# Patient Record
Sex: Female | Born: 1985 | Race: Asian | Hispanic: No | Marital: Married | Smoking: Never smoker
Health system: Southern US, Community
[De-identification: ages and names within clinical notes are randomized; demographics above are authoritative.]

## PROBLEM LIST (undated history)

## (undated) DIAGNOSIS — K602 Anal fissure, unspecified: Secondary | ICD-10-CM

## (undated) HISTORY — DX: Anal fissure, unspecified: K60.2

---

## 2013-07-01 LAB — OB RESULTS CONSOLE HEPATITIS B SURFACE ANTIGEN: HEP B S AG: NEGATIVE

## 2013-07-01 LAB — OB RESULTS CONSOLE ABO/RH: RH Type: POSITIVE

## 2013-07-01 LAB — OB RESULTS CONSOLE HIV ANTIBODY (ROUTINE TESTING): HIV: NONREACTIVE

## 2013-07-01 LAB — OB RESULTS CONSOLE ANTIBODY SCREEN: Antibody Screen: NEGATIVE

## 2013-07-01 LAB — OB RESULTS CONSOLE RPR: RPR: NONREACTIVE

## 2013-10-09 ENCOUNTER — Inpatient Hospital Stay (HOSPITAL_COMMUNITY): Admission: AD | Admit: 2013-10-09 | Payer: Self-pay | Source: Ambulatory Visit | Admitting: Obstetrics & Gynecology

## 2013-11-12 LAB — OB RESULTS CONSOLE RPR: RPR: NONREACTIVE

## 2014-01-07 LAB — OB RESULTS CONSOLE GBS: STREP GROUP B AG: NEGATIVE

## 2014-01-08 ENCOUNTER — Other Ambulatory Visit: Payer: Self-pay | Admitting: Obstetrics & Gynecology

## 2014-01-09 ENCOUNTER — Encounter (HOSPITAL_COMMUNITY): Payer: Self-pay

## 2014-01-15 ENCOUNTER — Encounter (HOSPITAL_COMMUNITY): Payer: Self-pay

## 2014-01-16 ENCOUNTER — Encounter (HOSPITAL_COMMUNITY)
Admission: RE | Admit: 2014-01-16 | Discharge: 2014-01-16 | Disposition: A | Discharge status: Home / Self Care | Payer: Managed Care, Other (non HMO) | Source: Ambulatory Visit | Attending: Obstetrics & Gynecology | Admitting: Obstetrics & Gynecology

## 2014-01-16 ENCOUNTER — Encounter (HOSPITAL_COMMUNITY): Payer: Self-pay

## 2014-01-16 LAB — CBC
HCT: 36.3 % (ref 36.0–46.0)
Hemoglobin: 12.1 g/dL (ref 12.0–15.0)
MCH: 28.5 pg (ref 26.0–34.0)
MCHC: 33.3 g/dL (ref 30.0–36.0)
MCV: 85.6 fL (ref 78.0–100.0)
Platelets: 239 10*3/uL (ref 150–400)
RBC: 4.24 MIL/uL (ref 3.87–5.11)
RDW: 14 % (ref 11.5–15.5)
WBC: 8.2 10*3/uL (ref 4.0–10.5)

## 2014-01-16 LAB — ABO/RH: ABO/RH(D): B POS

## 2014-01-16 LAB — TYPE AND SCREEN
ABO/RH(D): B POS
Antibody Screen: NEGATIVE

## 2014-01-16 LAB — RPR

## 2014-01-16 NOTE — Patient Instructions (Addendum)
   Your procedure is scheduled on:  Friday, Oct 23  Enter through the Main Entrance of [facility] at:  7:45 AM Pick up the phone at the desk and dial [phone] and inform us of your arrival.  Please call this number if you have any problems the morning of surgery: [phone]  Remember: Do not eat or drink after midnight:  Thursday Take these medicines the morning of surgery with a SIP OF WATER:  None  Do not wear jewelry, make-up, or FINGER nail polish No metal in your hair or on your body. Do not wear lotions, powders, perfumes.  You may wear deodorant.  Do not bring valuables to the hospital. Contacts, dentures or bridgework may not be worn into surgery.  Leave suitcase in the car. After Surgery it may be brought to your room. For patients being admitted to the hospital, checkout time is 11:00am the day of discharge.  Home with husband Corie Chiquitoanda cell [phone].

## 2014-01-17 ENCOUNTER — Inpatient Hospital Stay (HOSPITAL_COMMUNITY)
Admission: AD | Admit: 2014-01-17 | Discharge: 2014-01-19 | DRG: 765 | Disposition: A | Discharge status: Home / Self Care | Payer: Managed Care, Other (non HMO) | Source: Ambulatory Visit | Attending: Obstetrics & Gynecology | Admitting: Obstetrics & Gynecology

## 2014-01-17 ENCOUNTER — Encounter (HOSPITAL_COMMUNITY): Payer: Self-pay | Admitting: Anesthesiology

## 2014-01-17 ENCOUNTER — Encounter (HOSPITAL_COMMUNITY): Payer: Managed Care, Other (non HMO) | Admitting: Anesthesiology

## 2014-01-17 ENCOUNTER — Encounter (HOSPITAL_COMMUNITY)
Admission: AD | Disposition: A | Discharge status: Home / Self Care | Payer: Self-pay | Source: Ambulatory Visit | Attending: Obstetrics & Gynecology

## 2014-01-17 ENCOUNTER — Inpatient Hospital Stay (HOSPITAL_COMMUNITY): Payer: Managed Care, Other (non HMO) | Admitting: Anesthesiology

## 2014-01-17 DIAGNOSIS — O321XX0 Maternal care for breech presentation, not applicable or unspecified: Secondary | ICD-10-CM | POA: Diagnosis present

## 2014-01-17 DIAGNOSIS — Z3A37 37 weeks gestation of pregnancy: Secondary | ICD-10-CM | POA: Diagnosis present

## 2014-01-17 DIAGNOSIS — O4103X0 Oligohydramnios, third trimester, not applicable or unspecified: Secondary | ICD-10-CM | POA: Diagnosis present

## 2014-01-17 DIAGNOSIS — O365930 Maternal care for other known or suspected poor fetal growth, third trimester, not applicable or unspecified: Secondary | ICD-10-CM | POA: Diagnosis present

## 2014-01-17 SURGERY — Surgical Case
Anesthesia: Spinal | Site: Abdomen

## 2014-01-17 MED ORDER — PRENATAL MULTIVITAMIN CH
1.0000 | ORAL_TABLET | Freq: Every day | ORAL | Status: DC
  Administered 2014-01-17 – 2014-01-19 (×3): 1 via ORAL
  Filled 2014-01-17 (×3): qty 1

## 2014-01-17 MED ORDER — WITCH HAZEL-GLYCERIN EX PADS: 1.0000 "application " | MEDICATED_PAD | CUTANEOUS | Status: DC | PRN

## 2014-01-17 MED ORDER — METOCLOPRAMIDE HCL 5 MG/ML IJ SOLN: 10.0000 mg | Freq: Once | INTRAMUSCULAR | Status: DC | PRN

## 2014-01-17 MED ORDER — NALBUPHINE HCL 10 MG/ML IJ SOLN: 5.0000 mg | Freq: Once | INTRAMUSCULAR | Status: AC | PRN

## 2014-01-17 MED ORDER — FENTANYL CITRATE 0.05 MG/ML IJ SOLN
INTRAMUSCULAR | Status: AC
  Administered 2014-01-17: 25 ug via INTRAVENOUS
  Filled 2014-01-17: qty 2

## 2014-01-17 MED ORDER — CEFAZOLIN SODIUM-DEXTROSE 2-3 GM-% IV SOLR
2.0000 g | INTRAVENOUS | Status: AC
  Administered 2014-01-17: 2 g via INTRAVENOUS

## 2014-01-17 MED ORDER — DIPHENHYDRAMINE HCL 25 MG PO CAPS
25.0000 mg | ORAL_CAPSULE | ORAL | Status: DC | PRN
  Filled 2014-01-17: qty 1

## 2014-01-17 MED ORDER — LANOLIN HYDROUS EX OINT: 1.0000 "application " | TOPICAL_OINTMENT | CUTANEOUS | Status: DC | PRN

## 2014-01-17 MED ORDER — FENTANYL CITRATE 0.05 MG/ML IJ SOLN
INTRAMUSCULAR | Status: DC | PRN
  Administered 2014-01-17: 12.5 ug via INTRATHECAL

## 2014-01-17 MED ORDER — BUPIVACAINE IN DEXTROSE 0.75-8.25 % IT SOLN
INTRATHECAL | Status: DC | PRN
  Administered 2014-01-17: 1.2 mL via INTRATHECAL

## 2014-01-17 MED ORDER — MEPERIDINE HCL 25 MG/ML IJ SOLN: 6.2500 mg | INTRAMUSCULAR | Status: DC | PRN

## 2014-01-17 MED ORDER — ONDANSETRON HCL 4 MG/2ML IJ SOLN: 4.0000 mg | INTRAMUSCULAR | Status: DC | PRN

## 2014-01-17 MED ORDER — MORPHINE SULFATE (PF) 0.5 MG/ML IJ SOLN
INTRAMUSCULAR | Status: DC | PRN
  Administered 2014-01-17: 200 ug via INTRATHECAL

## 2014-01-17 MED ORDER — PHENYLEPHRINE HCL 10 MG/ML IJ SOLN
INTRAMUSCULAR | Status: DC | PRN
  Administered 2014-01-17: 60 ug via INTRAVENOUS

## 2014-01-17 MED ORDER — DIPHENHYDRAMINE HCL 50 MG/ML IJ SOLN
12.5000 mg | INTRAMUSCULAR | Status: DC | PRN
  Administered 2014-01-17 (×2): 12.5 mg via INTRAVENOUS
  Filled 2014-01-17: qty 1

## 2014-01-17 MED ORDER — DIPHENHYDRAMINE HCL 50 MG/ML IJ SOLN
INTRAMUSCULAR | Status: AC
  Filled 2014-01-17: qty 1

## 2014-01-17 MED ORDER — SCOPOLAMINE 1 MG/3DAYS TD PT72: 1.0000 | MEDICATED_PATCH | TRANSDERMAL | Status: DC

## 2014-01-17 MED ORDER — KETOROLAC TROMETHAMINE 30 MG/ML IJ SOLN
INTRAMUSCULAR | Status: AC
  Filled 2014-01-17: qty 1

## 2014-01-17 MED ORDER — SENNOSIDES-DOCUSATE SODIUM 8.6-50 MG PO TABS
2.0000 | ORAL_TABLET | ORAL | Status: DC
Start: 2014-01-18 — End: 2014-01-19
  Administered 2014-01-18 (×2): 2 via ORAL
  Filled 2014-01-17 (×2): qty 2

## 2014-01-17 MED ORDER — NALBUPHINE HCL 10 MG/ML IJ SOLN: 5.0000 mg | INTRAMUSCULAR | Status: DC | PRN

## 2014-01-17 MED ORDER — KETOROLAC TROMETHAMINE 30 MG/ML IJ SOLN
30.0000 mg | Freq: Four times a day (QID) | INTRAMUSCULAR | Status: AC | PRN
  Administered 2014-01-17: 30 mg via INTRAMUSCULAR

## 2014-01-17 MED ORDER — SCOPOLAMINE 1 MG/3DAYS TD PT72
MEDICATED_PATCH | TRANSDERMAL | Status: AC
  Filled 2014-01-17: qty 1

## 2014-01-17 MED ORDER — OXYTOCIN 40 UNITS IN LACTATED RINGERS INFUSION - SIMPLE MED: 62.5000 mL/h | INTRAVENOUS | Status: AC

## 2014-01-17 MED ORDER — ZOLPIDEM TARTRATE 5 MG PO TABS: 5.0000 mg | ORAL_TABLET | Freq: Every evening | ORAL | Status: DC | PRN

## 2014-01-17 MED ORDER — KETOROLAC TROMETHAMINE 30 MG/ML IJ SOLN: 30.0000 mg | Freq: Four times a day (QID) | INTRAMUSCULAR | Status: AC | PRN

## 2014-01-17 MED ORDER — MEPERIDINE HCL 25 MG/ML IJ SOLN
INTRAMUSCULAR | Status: AC
  Administered 2014-01-17: 6.25 mg via INTRAVENOUS
  Filled 2014-01-17: qty 1

## 2014-01-17 MED ORDER — NALOXONE HCL 0.4 MG/ML IJ SOLN: 0.4000 mg | INTRAMUSCULAR | Status: DC | PRN

## 2014-01-17 MED ORDER — NALBUPHINE HCL 10 MG/ML IJ SOLN
INTRAMUSCULAR | Status: AC
  Administered 2014-01-17: 5 mg via SUBCUTANEOUS
  Filled 2014-01-17: qty 1

## 2014-01-17 MED ORDER — SIMETHICONE 80 MG PO CHEW: 80.0000 mg | CHEWABLE_TABLET | ORAL | Status: DC | PRN

## 2014-01-17 MED ORDER — TETANUS-DIPHTH-ACELL PERTUSSIS 5-2.5-18.5 LF-MCG/0.5 IM SUSP: 0.5000 mL | Freq: Once | INTRAMUSCULAR | Status: DC

## 2014-01-17 MED ORDER — PHENYLEPHRINE HCL 10 MG/ML IJ SOLN
INTRAMUSCULAR | Status: AC
  Filled 2014-01-17: qty 1

## 2014-01-17 MED ORDER — MENTHOL 3 MG MT LOZG: 1.0000 | LOZENGE | OROMUCOSAL | Status: DC | PRN

## 2014-01-17 MED ORDER — OXYCODONE-ACETAMINOPHEN 5-325 MG PO TABS: 2.0000 | ORAL_TABLET | ORAL | Status: DC | PRN

## 2014-01-17 MED ORDER — SIMETHICONE 80 MG PO CHEW
80.0000 mg | CHEWABLE_TABLET | Freq: Three times a day (TID) | ORAL | Status: DC
Start: 2014-01-17 — End: 2014-01-19
  Administered 2014-01-17 – 2014-01-19 (×6): 80 mg via ORAL
  Filled 2014-01-17 (×6): qty 1

## 2014-01-17 MED ORDER — PHENYLEPHRINE 40 MCG/ML (10ML) SYRINGE FOR IV PUSH (FOR BLOOD PRESSURE SUPPORT)
PREFILLED_SYRINGE | INTRAVENOUS | Status: AC
  Filled 2014-01-17: qty 5

## 2014-01-17 MED ORDER — LACTATED RINGERS IV SOLN
INTRAVENOUS | Status: DC
  Administered 2014-01-17 – 2014-01-18 (×2): via INTRAVENOUS

## 2014-01-17 MED ORDER — IBUPROFEN 600 MG PO TABS
600.0000 mg | ORAL_TABLET | Freq: Four times a day (QID) | ORAL | Status: DC
  Administered 2014-01-17 – 2014-01-19 (×8): 600 mg via ORAL
  Filled 2014-01-17 (×8): qty 1

## 2014-01-17 MED ORDER — DEXTROSE 5 % IV SOLN
1.0000 ug/kg/h | INTRAVENOUS | Status: DC | PRN
  Filled 2014-01-17: qty 2

## 2014-01-17 MED ORDER — OXYTOCIN 10 UNIT/ML IJ SOLN
40.0000 [IU] | INTRAVENOUS | Status: DC | PRN
  Administered 2014-01-17: 40 [IU] via INTRAVENOUS

## 2014-01-17 MED ORDER — SODIUM CHLORIDE 0.9 % IJ SOLN: 3.0000 mL | INTRAMUSCULAR | Status: DC | PRN

## 2014-01-17 MED ORDER — CEFAZOLIN SODIUM-DEXTROSE 2-3 GM-% IV SOLR
INTRAVENOUS | Status: AC
  Administered 2014-01-17: 2 g via INTRAVENOUS
  Filled 2014-01-17: qty 50

## 2014-01-17 MED ORDER — ONDANSETRON HCL 4 MG/2ML IJ SOLN
INTRAMUSCULAR | Status: DC | PRN
  Administered 2014-01-17: 4 mg via INTRAVENOUS

## 2014-01-17 MED ORDER — MORPHINE SULFATE 0.5 MG/ML IJ SOLN
INTRAMUSCULAR | Status: AC
  Filled 2014-01-17: qty 10

## 2014-01-17 MED ORDER — FENTANYL CITRATE 0.05 MG/ML IJ SOLN
INTRAMUSCULAR | Status: AC
Start: 2014-01-17 — End: 2014-01-17
  Filled 2014-01-17: qty 2

## 2014-01-17 MED ORDER — ONDANSETRON HCL 4 MG PO TABS: 4.0000 mg | ORAL_TABLET | ORAL | Status: DC | PRN

## 2014-01-17 MED ORDER — SCOPOLAMINE 1 MG/3DAYS TD PT72
1.0000 | MEDICATED_PATCH | Freq: Once | TRANSDERMAL | Status: DC
Start: 2014-01-17 — End: 2014-01-17
  Administered 2014-01-17: 1.5 mg via TRANSDERMAL

## 2014-01-17 MED ORDER — FENTANYL CITRATE 0.05 MG/ML IJ SOLN
25.0000 ug | INTRAMUSCULAR | Status: DC | PRN
  Administered 2014-01-17 (×2): 25 ug via INTRAVENOUS

## 2014-01-17 MED ORDER — DIPHENHYDRAMINE HCL 25 MG PO CAPS: 25.0000 mg | ORAL_CAPSULE | Freq: Four times a day (QID) | ORAL | Status: DC | PRN

## 2014-01-17 MED ORDER — SIMETHICONE 80 MG PO CHEW
80.0000 mg | CHEWABLE_TABLET | ORAL | Status: DC
  Administered 2014-01-18 (×2): 80 mg via ORAL
  Filled 2014-01-17 (×2): qty 1

## 2014-01-17 MED ORDER — DIBUCAINE 1 % RE OINT: 1.0000 "application " | TOPICAL_OINTMENT | RECTAL | Status: DC | PRN

## 2014-01-17 MED ORDER — MEPERIDINE HCL 25 MG/ML IJ SOLN
6.2500 mg | INTRAMUSCULAR | Status: DC | PRN
  Administered 2014-01-17: 6.25 mg via INTRAVENOUS

## 2014-01-17 MED ORDER — OXYCODONE-ACETAMINOPHEN 5-325 MG PO TABS
1.0000 | ORAL_TABLET | ORAL | Status: DC | PRN
  Administered 2014-01-18 – 2014-01-19 (×2): 1 via ORAL
  Filled 2014-01-17 (×2): qty 1

## 2014-01-17 MED ORDER — ONDANSETRON HCL 4 MG/2ML IJ SOLN
INTRAMUSCULAR | Status: AC
  Filled 2014-01-17: qty 2

## 2014-01-17 MED ORDER — NALBUPHINE HCL 10 MG/ML IJ SOLN
5.0000 mg | INTRAMUSCULAR | Status: DC | PRN
  Administered 2014-01-17: 5 mg via SUBCUTANEOUS

## 2014-01-17 MED ORDER — LACTATED RINGERS IV SOLN
INTRAVENOUS | Status: DC
  Administered 2014-01-17 (×2): via INTRAVENOUS

## 2014-01-17 MED ORDER — OXYTOCIN 10 UNIT/ML IJ SOLN
INTRAMUSCULAR | Status: AC
  Filled 2014-01-17: qty 4

## 2014-01-17 MED ORDER — ONDANSETRON HCL 4 MG/2ML IJ SOLN: 4.0000 mg | Freq: Three times a day (TID) | INTRAMUSCULAR | Status: DC | PRN

## 2014-01-17 SURGICAL SUPPLY — 39 items
BENZOIN TINCTURE PRP APPL 2/3 (GAUZE/BANDAGES/DRESSINGS) ×3 IMPLANT
CLAMP CORD UMBIL (MISCELLANEOUS) IMPLANT
CLOSURE WOUND 1/2 X4 (GAUZE/BANDAGES/DRESSINGS) ×1
CLOTH BEACON ORANGE TIMEOUT ST (SAFETY) ×3 IMPLANT
CONTAINER PREFILL 10% NBF 15ML (MISCELLANEOUS) IMPLANT
DRAPE SHEET LG 3/4 BI-LAMINATE (DRAPES) ×3 IMPLANT
DRSG OPSITE POSTOP 4X10 (GAUZE/BANDAGES/DRESSINGS) ×3 IMPLANT
DURAPREP 26ML APPLICATOR (WOUND CARE) ×3 IMPLANT
ELECT REM PT RETURN 9FT ADLT (ELECTROSURGICAL) ×3
ELECTRODE REM PT RTRN 9FT ADLT (ELECTROSURGICAL) ×1 IMPLANT
EXTRACTOR VACUUM KIWI (MISCELLANEOUS) IMPLANT
EXTRACTOR VACUUM M CUP 4 TUBE (SUCTIONS) IMPLANT
EXTRACTOR VACUUM M CUP 4' TUBE (SUCTIONS)
GLOVE BIO SURGEON STRL SZ7 (GLOVE) ×3 IMPLANT
GLOVE BIOGEL PI IND STRL 7.0 (GLOVE) ×5 IMPLANT
GLOVE BIOGEL PI INDICATOR 7.0 (GLOVE) ×10
GLOVE ECLIPSE 7.5 STRL STRAW (GLOVE) ×3 IMPLANT
GOWN STRL REUS W/TWL LRG LVL3 (GOWN DISPOSABLE) ×9 IMPLANT
KIT ABG SYR 3ML LUER SLIP (SYRINGE) IMPLANT
NEEDLE HYPO 25X5/8 SAFETYGLIDE (NEEDLE) IMPLANT
NS IRRIG 1000ML POUR BTL (IV SOLUTION) ×3 IMPLANT
PACK C SECTION WH (CUSTOM PROCEDURE TRAY) ×3 IMPLANT
PAD OB MATERNITY 4.3X12.25 (PERSONAL CARE ITEMS) ×3 IMPLANT
RTRCTR C-SECT PINK 25CM LRG (MISCELLANEOUS) IMPLANT
STAPLER VISISTAT 35W (STAPLE) IMPLANT
STRIP CLOSURE SKIN 1/2X4 (GAUZE/BANDAGES/DRESSINGS) ×2 IMPLANT
SUT MON AB-0 CT1 36 (SUTURE) ×9 IMPLANT
SUT PLAIN 0 NONE (SUTURE) IMPLANT
SUT PLAIN 2 0 (SUTURE) ×2
SUT PLAIN ABS 2-0 CT1 27XMFL (SUTURE) ×1 IMPLANT
SUT VIC AB 0 CT1 27 (SUTURE) ×4
SUT VIC AB 0 CT1 27XBRD ANBCTR (SUTURE) ×2 IMPLANT
SUT VIC AB 2-0 CT1 27 (SUTURE) ×4
SUT VIC AB 2-0 CT1 TAPERPNT 27 (SUTURE) ×2 IMPLANT
SUT VIC AB 4-0 KS 27 (SUTURE) ×3 IMPLANT
SUT VICRYL 0 TIES 12 18 (SUTURE) IMPLANT
TOWEL OR 17X24 6PK STRL BLUE (TOWEL DISPOSABLE) ×3 IMPLANT
TRAY FOLEY CATH 14FR (SET/KITS/TRAYS/PACK) IMPLANT
WATER STERILE IRR 1000ML POUR (IV SOLUTION) ×3 IMPLANT

## 2014-01-17 NOTE — Consult Note (Signed)
[facility]  Delivery Note:  C-section       01/17/2014  8:50 AM  I was called to the operating room at the request of the patient's obstetrician (Dr. Juliene PinaMody) for a primary c-section.  PRENATAL HX:  28 y/o G1P0 at 4637 and 5/[redacted] weeks gestation.  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  Delivery indicated at this time for oligohydramnios and mode by c-section for breech presentation.    INTRAPARTUM HX:   Primary c-section with AROM at delivery.  DELIVERY:  Infant was vigorous at delivery, requiring no resuscitation other than standard warming, drying and stimulation.  APGARs 8 and 9.  Exam notable for 2 vessel cord but otherwise was within normal limits.  There is no murmur at this time to suggest a VSD, though cannot rule it out at this time, as murmur may not become apparent until pulmonary vascular resistance decreases.  After 5 minutes, baby left with nurse to assist parents with skin-to-skin care.   _____________________ Electronically Signed By: Maryan CharLindsey Luane Rochon, MD Neonatologist

## 2014-01-17 NOTE — Anesthesia Procedure Notes (Signed)
Spinal  Patient location during procedure: OR Start time: 01/17/2014 8:25 AM End time: 01/17/2014 8:27 AM Staffing Anesthesiologist: Leilani AbleHATCHETT, Caydan Mctavish Performed by: anesthesiologist  Preanesthetic Checklist Completed: patient identified, surgical consent, pre-op evaluation, timeout performed, IV checked, risks and benefits discussed and monitors and equipment checked Spinal Block Patient position: sitting Prep: site prepped and draped and DuraPrep Patient monitoring: heart rate, cardiac monitor, continuous pulse ox and blood pressure Approach: midline Location: L3-4 Injection technique: single-shot Needle Needle type: Pencan  Needle gauge: 24 G Needle length: 9 cm Needle insertion depth: 5 cm Assessment Sensory level: T4

## 2014-01-17 NOTE — Anesthesia Preprocedure Evaluation (Addendum)
Anesthesia Evaluation  Patient identified by MRN, date of birth, ID band Patient awake    Reviewed: Allergy & Precautions, H&P , NPO status , Patient's Chart, lab work & pertinent test results  Airway Mallampati: II TM Distance: >3 FB Neck ROM: Full    Dental no notable dental hx. (+) Teeth Intact   Pulmonary neg pulmonary ROS,  breath sounds clear to auscultation  Pulmonary exam normal       Cardiovascular negative cardio ROS  Rhythm:Regular Rate:Normal     Neuro/Psych negative neurological ROS  negative psych ROS   GI/Hepatic negative GI ROS, Neg liver ROS,   Endo/Other  negative endocrine ROS  Renal/GU negative Renal ROS  negative genitourinary   Musculoskeletal negative musculoskeletal ROS (+)   Abdominal   Peds  Hematology negative hematology ROS (+)   Anesthesia Other Findings   Reproductive/Obstetrics (+) Pregnancy Breech Oligohydramnios                          Anesthesia Physical Anesthesia Plan  ASA: II  Anesthesia Plan: Spinal   Post-op Pain Management:    Induction:   Airway Management Planned: Natural Airway  Additional Equipment:   Intra-op Plan:   Post-operative Plan:   Informed Consent: I have reviewed the patients History and Physical, chart, labs and discussed the procedure including the risks, benefits and alternatives for the proposed anesthesia with the patient or authorized representative who has indicated his/her understanding and acceptance.     Plan Discussed with: Anesthesiologist, CRNA and Surgeon  Anesthesia Plan Comments:         Anesthesia Quick Evaluation

## 2014-01-17 NOTE — Lactation Note (Signed)
This note was copied from the chart of Taylor Rangel. Lactation Consultation Note  Patient Name: Taylor Malachy MoanSneha Guttman Today's Date: 01/17/2014 Reason for consult: Initial assessment  Initial consult; GA 37.5; 5 lbs, 9 oz BW.  Infant 10 hrs old at time of visit and has breastfed x2 attempts + formula x1 6ml (Similac 22 cal); voids-2; stools-1.  LC offered assistance with breastfeeding and parents consented.  Infant had just received bath and was alert and vigorous for breastfeeding.  Mom has small, flat, short nipples; will evert with stimulation but flattens when trying to latch; areolas are swollen and non-compressible.  Mom had been using hand pump prior to latching but baby still could not get latched.  Hand expression taught with return demonstration and observation of colostrum.  Infant was vigorously trying to get latched but could not latch deep enough to sustain latch.  Applied #16 nipple shield and infant easily latched, sustained latch, and sucked in a rhythmical pattern with some stimulation needed to keep her sucking; infant had a tight mouth.  LC kept trying to get her to open her mouth wider but could not get infant to open with a wide latch. Small drops of colostrum noted in nipple shield.  Taught parents how to latch using sandwiching technique and cross-cradle hold.  Educated to feed with feeding cues, and importance of exclusive breastfeeding r/t milk production and supply/demand.  Supplementation guideline sheet given but encouraged to breastfeed prior to offering formula.  Risk of formula education given. Educated on size of infant's stomach.  Lactation brochure given and informed of hospital support group and outpatient services.  Encouraged to call for assistance if needed.     Maternal Data Formula Feeding for Exclusion: No Has patient been taught Hand Expression?: Yes Does the patient have breastfeeding experience prior to this delivery?: No  Feeding Feeding Type:  Breast Fed Length of feed: 10 min  LATCH Score/Interventions Latch: Repeated attempts needed to sustain latch, nipple held in mouth throughout feeding, stimulation needed to elicit sucking reflex. Intervention(s): Skin to skin;Teach feeding cues;Waking techniques  Audible Swallowing: A few with stimulation Intervention(s): Hand expression  Type of Nipple: Flat Intervention(s): Hand pump (nipple shield used after several attempts)  Comfort (Breast/Nipple): Soft / non-tender     Hold (Positioning): Assistance needed to correctly position infant at breast and maintain latch. Intervention(s): Breastfeeding basics reviewed;Support Pillows;Skin to skin  LATCH Score: 6  Lactation Tools Discussed/Used Tools: Nipple Shields Nipple shield size: 16 Initiated by:: Rn Date initiated:: 01/17/14   Consult Status Consult Status: Follow-up Date: 01/18/14 Follow-up type: In-patient    Lendon KaVann, Juwan Vences Walker 01/17/2014, 8:02 PM

## 2014-01-17 NOTE — Op Note (Signed)
Procedure Note: Low Transverse Cesarean Section   Taylor Rangel 01/17/2014  Indications: Breech Presentation and Oligohydramnios. Persistent oligohydramnios with AFI 3-4 cm since 35 wks.   Pre-operative Diagnosis: Breech, Oligohydramnios, 37.5 wks.   Post-operative Diagnosis: Same   Surgeon: Robley FriesVaishali R Veryl Abril, MD   Assistants: Marlinda Mikeanya Bailey, CNM   Anesthesia: Spinal   Procedure Details:  The patient was seen in the Holding Room. The risks, benefits, complications, treatment options, and expected outcomes were discussed with the patient. The patient concurred with the proposed plan, giving informed consent. identified as Taylor Rangel and the procedure verified as C-Section Delivery. A Time Out was held and the above information confirmed. 2 gm Ancef given.  After induction of Spinal anesthesia, foley placed, the patient was draped and prepped in the usual sterile manner. A Pfannenstiel Incision was made and carried down through the subcutaneous tissue to the fascia. Fascial incision was made and extended transversely. The fascia was separated from the underlying rectus tissue superiorly and inferiorly. The peritoneum was identified and entered. Peritoneal incision was extended longitudinally. The utero-vesical peritoneal reflection was incised transversely and the bladder flap was bluntly freed from the lower uterine segment. A low transverse uterine incision was made. Delivered from Complete breech presentation was a healthy Female infant at 8.52 am on 01/17/14, weight 5 lb 9 oz,  Apgar scores of 8 at one minute and 9 at five minutes. Baby was received by NICU team and initial evaluation was normal. Cord ph was not sent, the umbilical cord was clamped and cut cord blood was obtained for evaluation. The placenta was removed Intact and appeared normal with a 2 vessel cord, sent to pathology. The uterine outline, tubes and ovaries appeared normal, no uterine anomaly noted. The uterine incision was  closed in 2 layers with running locked sutures of 0 Monocryl followed by another imbricating layer. Hemostasis was observed. Peritoneal closure done with 2-0 Vicryl. Pyramidalis sutured together in midline. The fascia was then reapproximated with running sutures of 0Vicryl. The subcuticular closure was performed using 2-0plain gut. The skin was closed with 4-0Vicryl. Steristrips and dressing placed.   Instrument, sponge, and needle counts were correct prior the abdominal closure and were correct at the conclusion of the case.   Findings: Female infant delivered from low transverse cesarean incision by complete breech extraction from Complete Breech position at 8.52 am on 01/17/14. Apgars 8 and 9 at 1 minute and 5 minutes. Weight 5 lb 9 oz. Normal uterus, tubes, ovaries. 2vessel cord, normal placenta.    Estimated Blood Loss: 500 cc   Total IV Fluids:  1500 ml LR  Urine Output: 300CC OF clear urine  Specimens: Placenta, cord blood  Complications: no complications  Disposition: PACU - hemodynamically stable.   Maternal Condition: stable   Baby condition / location:  Couplet care / Skin to Skin  Attending Attestation: I performed the procedure.   Signed: Surgeon(s): Robley FriesVaishali R Mikahla Wisor, MD

## 2014-01-17 NOTE — Transfer of Care (Signed)
Immediate Anesthesia Transfer of Care Note  Patient: Taylor Rangel  Procedure(s) Performed: Procedure(s) with comments: Primary CESAREAN SECTION (N/A) - EDD: 02/02/14  Patient Location: PACU  Anesthesia Type:Spinal  Level of Consciousness: awake, alert  and oriented  Airway & Oxygen Therapy: Patient Spontanous Breathing  Post-op Assessment: Report given to PACU RN and Post -op Vital signs reviewed and stable  Post vital signs: Reviewed and stable  Complications: No apparent anesthesia complications

## 2014-01-17 NOTE — Anesthesia Postprocedure Evaluation (Signed)
Anesthesia Post Note  Patient: Taylor Rangel  Procedure(s) Performed: Procedure(s) (LRB): Primary CESAREAN SECTION (N/A)  Anesthesia type: Spinal  Patient location: PACU  Post pain: Pain level controlled  Post assessment: Post-op Vital signs reviewed  Last Vitals:  Filed Vitals:   01/17/14 1015  BP: 94/58  Pulse: 62  Temp:   Resp: 20    Post vital signs: Reviewed  Level of consciousness: awake  Complications: No apparent anesthesia complications

## 2014-01-17 NOTE — H&P (Signed)
Taylor Rangel is a 28 y.o. female presenting for C/section for Breech with persistent oligohydramnios. No complaints.  PNCare from 8 wks, Normal screening. sono noted 2vessel cord, fetal echo noted suspected VSD needs f/up after birth. Breech since 28 wks, SGA and oligohydramnios.    History OB History   Grav Para Term Preterm Abortions TAB SAB Ect Mult Living   1              No past medical history on file. Past Surgical History  Procedure Laterality Date  . No past surgeries     Family History: family history is not on file. Social History:  reports that she has never smoked. She has never used smokeless tobacco. She reports that she does not drink alcohol or use illicit drugs.   Prenatal Transfer Tool  Maternal Diabetes: No Genetic Screening: Normal Maternal Ultrasounds/Referrals: Abnormal:  Findings:   Cardiac defect, Other: 2 vessel cord Fetal Ultrasounds or other Referrals:  Fetal echo Maternal Substance Abuse:  No Significant Maternal Medications:  None Significant Maternal Lab Results:  Lab values include: Group B Strep negative Other Comments:  None  ROS  Neg     Blood pressure 117/76, pulse 98, temperature 98.4 F (36.9 C), temperature source Oral, resp. rate 100, last menstrual period 04/28/2013, SpO2 100.00%. Exam Physical Exam  A&O x 3, no acute distress. Pleasant HEENT neg, no thyromegaly Lungs CTA bilat CV RRR, S1S2 normal Abdo soft, non tender, non acute Extr no edema/ tenderness Pelvic deferred FHT  Present, normal Toco none  Prenatal labs: ABO, Rh: --/--/B POS (10/22 1250) Antibody: NEG (10/22 1248) Rubella:  Indeterminate RPR: NON REAC (10/22 1248)  HBsAg: Negative (04/06 0000)  HIV: Non-reactive (04/06 0000)  GBS: Negative (10/13 0000)  Glucola normal Sono- abn- 2v cord, oligohydramnios  Assessment/Plan: 28 yo, G1 at 37.5 wks, here for C/s for persistent oligohydramnios and breech. 2 vessel cord and fetal echo suspects VSD.   Risks/complications of surgery reviewed incl infection, bleeding, damage to internal organs including bladder, bowels, ureters, blood vessels, other risks from anesthesia, VTE and delayed complications of any surgery, complications in future surgery reviewed. Also discussed neonatal complications incl difficult delivery, laceration, vacuum assistance, TTN etc. Pt understands and agrees, all concerns addressed.   '  Taylor Rangel 01/17/2014, 8:08 AM

## 2014-01-18 LAB — CBC
HCT: 32.4 % — ABNORMAL LOW (ref 36.0–46.0)
HEMOGLOBIN: 10.7 g/dL — AB (ref 12.0–15.0)
MCH: 28.6 pg (ref 26.0–34.0)
MCHC: 33 g/dL (ref 30.0–36.0)
MCV: 86.6 fL (ref 78.0–100.0)
Platelets: 183 10*3/uL (ref 150–400)
RBC: 3.74 MIL/uL — ABNORMAL LOW (ref 3.87–5.11)
RDW: 14.1 % (ref 11.5–15.5)
WBC: 7.9 10*3/uL (ref 4.0–10.5)

## 2014-01-18 NOTE — Lactation Note (Signed)
This note was copied from the chart of Taylor Rangel. Lactation Consultation Note  Patient Name: Taylor Rangel's Date: 01/18/2014 Reason for consult: Follow-up assessment;Infant < 6lbs;Late preterm infant born at 37 weeks 5 days.  Baby received 12 ml's of formula supplement 2 hours ago and is asleep but has been spitting after supplement, so LC reinforced instructions given to parents by RN concerning offering 8-10 ml's for now until spitting stops.  LC reviewed importance of regular use of DEBP for stimulation of her milk production and as available, to have ebm to feed baby after breastfeeding due to baby's weight below 6 pounds and borderline LPI.  Parents both verbalize understanding and LPI handout reviewed, with instructions to limit amount to lower end if baby is spitty.  LC also reviewed pumping frequency recommendation (q3h) and milk storage guidelines for ebm (page 25 in Baby and Me).   Maternal Data    Feeding Feeding Type: Formula Length of feed: 10 min  LATCH Score/Interventions    Intervention(s): Hand expression  Intervention(s): Double electric pump      most recent LATCH score=5 at two consecutive feedings but mom has been shown hand expression and FOB states they offer any drops of ebm to baby on tip of their clean finger.        Lactation Tools Discussed/Used Pump Review: Setup, frequency, and cleaning;Milk Storage (mom has already been instructed but LC reinforced pumping and storing instructions)   Consult Status Consult Status: Follow-up Date: 01/19/14 Follow-up type: In-patient    [name], [name] 01/18/2014, 5:22 PM

## 2014-01-18 NOTE — Progress Notes (Signed)
Patient ID: Malachy MoanSneha Wiginton, female   DOB: 09/30/1985, 28 y.o.   MRN: [id number] Subjective: POD# 1 Information for the patient's newborn:  Darrick GrinderBillanuka, Girl Electa SniffSneha [[id number]][[id number]]  female   Reports feeling tired and a little sore Feeding: breast and bottle - formula supplementation Patient reports tolerating PO.  Breast symptoms: very little colostrum Pain controlled with ibuprofen (OTC) and narcotic analgesics including Percocet Denies HA/SOB/C/P/N/V/dizziness. Flatus none. No BM. She reports vaginal bleeding as normal, without clots.  She is ambulating, urinating without difficult.     Objective:   VS:  Filed Vitals:   01/18/14 0100 01/18/14 0500 01/18/14 0504 01/18/14 0900  BP: 97/52 100/56 97/53 92/50   Pulse: 67 80  77  Temp: 98.8 F (37.1 C) 98.1 F (36.7 C)  98.4 F (36.9 C)  TempSrc: Oral Oral  Oral  Resp: 16 16  18   Height:      Weight:      SpO2: 97% 97%       Intake/Output Summary (Last 24 hours) at 01/18/14 1440 Last data filed at 01/18/14 0500  Gross per 24 hour  Intake   2006 ml  Output   1675 ml  Net    331 ml        Recent Labs  01/16/14 1249 01/18/14 0607  WBC 8.2 7.9  HGB 12.1 10.7*  HCT 36.3 32.4*  PLT 239 183     Blood type: --/--/B POS (10/22 1250)  Rubella:  Immune    Physical Exam:  General: alert, cooperative, fatigued and no distress CV: Regular rate and rhythm, S1S2 present or without murmur or extra heart sounds Resp: clear Abdomen: soft, nontender, hypoactive bowel sounds Incision: dried serosanguineous on 75% of Honeycomb dressing drainage present Uterine Fundus: firm, 1 FB below umbilicus, nontender Lochia: minimal Ext: extremities normal, atraumatic, no cyanosis or edema, Homans sign is negative, no sign of DVT and no edema, redness or tenderness in the calves or thighs   Assessment/Plan: 28 y.o.   POD# 1.  s/p Cesarean Delivery.  Indications: breech and oligohydramnios                Principal Problem:   Postpartum care  following cesarean delivery (10/23) Active Problems:   Breech presentation of fetus   Oligohydramnios in third trimester, antepartum   Cesarean delivery delivered  Doing well, stable.               Regular diet as tolerated Ambulate Routine post-op care  Kenard GowerAWSON, Tiani Stanbery, M, MSN, CNM 01/18/2014, 2:30 PM

## 2014-01-18 NOTE — Addendum Note (Signed)
Addendum created 01/18/14 [id number] by Shanon PayorSuzanne M Hattye Siegfried, CRNA   Modules edited: Notes Section   Notes Section:  File: [id number]

## 2014-01-18 NOTE — Anesthesia Postprocedure Evaluation (Signed)
  Anesthesia Post-op Note  Patient: Taylor MoanSneha Rangel  Procedure(s) Performed: Procedure(s) with comments: Primary CESAREAN SECTION (N/A) - EDD: 02/02/14  Patient Location: Mother/Baby  Anesthesia Type:Spinal  Level of Consciousness: awake, alert  and oriented  Airway and Oxygen Therapy: Patient Spontanous Breathing  Post-op Pain: none  Post-op Assessment: Post-op Vital signs reviewed, Patient's Cardiovascular Status Stable, Respiratory Function Stable, No headache, No backache, No residual numbness and No residual motor weakness  Post-op Vital Signs: Reviewed and stable  Last Vitals:  Filed Vitals:   01/18/14 0504  BP: 97/53  Pulse:   Temp:   Resp:     Complications: No apparent anesthesia complications

## 2014-01-19 ENCOUNTER — Ambulatory Visit: Payer: Self-pay

## 2014-01-19 MED ORDER — OXYCODONE-ACETAMINOPHEN 5-325 MG PO TABS: 1.0000 | ORAL_TABLET | ORAL | Status: DC | PRN

## 2014-01-19 MED ORDER — IBUPROFEN 600 MG PO TABS: 600.0000 mg | ORAL_TABLET | Freq: Four times a day (QID) | ORAL | Status: DC

## 2014-01-19 NOTE — Discharge Instructions (Signed)
Breast Pumping Tips °If you are breastfeeding, there may be times when you cannot feed your baby directly. Returning to work or going on a trip are common examples. Pumping allows you to store breast milk and feed it to your baby later.  °You may not get much milk when you first start to pump. Your breasts should start to make more after a few days. If you pump at the times you usually feed your baby, you may be able to keep making enough milk to feed your baby without also using formula. The more often you pump, the more milk you will produce.  °WHEN SHOULD I PUMP?  °· You can begin to pump soon after delivery. However, some experts recommend waiting about 4 weeks before giving your infant a bottle to make sure breastfeeding is going well.  °· If you plan to return to work, begin pumping a few weeks before. This will help you develop techniques that work best for you. It also lets you build up a supply of breast milk.   °· When you are with your infant, feed on demand and pump after each feeding.   °· When you are away from your infant for several hours, pump for about 15 minutes every 2-3 hours. Pump both breasts at the same time if you can.   °· If your infant has a formula feeding, make sure to pump around the same time.     °· If you drink any alcohol, wait 2 hours before pumping.   °HOW DO I PREPARE TO PUMP? °Your let-down reflex is the natural reaction to stimulation that makes your breast milk flow. It is easier to stimulate this reflex when you are relaxed. Find relaxation techniques that work for you. If you have difficulty with your let-down reflex, try these methods:  °· Smell one of your infant's blankets or an item of clothing.   °· Look at a picture or video of your infant.   °· Sit in a quiet, private space.   °· Massage the breast you plan to pump.   °· Place soothing warmth on the breast.   °· Play relaxing music.   °WHAT ARE SOME GENERAL BREAST PUMPING TIPS? °· Wash your hands before you pump. You  do not need to wash your nipples or breasts. °· There are three ways to pump. °¨ You can use your hand to massage and compress your breast. °¨ You can use a handheld manual pump. °¨ You can use an electric pump.   °· Make sure the suction cup (flange) on the breast pump is the right size. Place the flange directly over the nipple. If it is the wrong size or placed the wrong way, it may be painful and cause nipple damage.   °· If pumping is uncomfortable, apply a small amount of purified or modified lanolin to your nipple and areola. °· If you are using an electric pump, adjust the speed and suction power to be more comfortable. °· If pumping is painful or if you are not getting very much milk, you may need a different type of pump. A lactation consultant can help you determine what type of pump to use.   °· Keep a full water bottle near you at all times. Drinking lots of fluid helps you make more milk.  °· You can store your milk to use later. Pumped breast milk can be stored in a sealable, sterile container or plastic bag. Label all stored breast milk with the date you pumped it. °¨ Milk can stay out at room temperature for up to 8 hours. °¨   You can store your milk in the refrigerator for up to 8 days. °¨ You can store your milk in the freezer for 3 months. Thaw frozen milk using warm water. Do not put it in the microwave. °· Do not smoke. Smoking can lower your milk supply and harm your infant. If you need help quitting, ask your health care provider to recommend a program.   °WHEN SHOULD I CALL MY HEALTH CARE PROVIDER OR A LACTATION CONSULTANT? °· You are having trouble pumping. °· You are concerned that you are not making enough milk. °· You have nipple pain, soreness, or redness. °· You want to use birth control. Birth control pills may lower your milk supply. Talk to your health care provider about your options. °Document Released: 09/01/2009 Document Revised: 03/19/2013 Document Reviewed:  01/04/2013 °Exit[employer]® Patient Information ©2015 [employer]. This information is not intended to replace advice given to you by your health care provider. Make sure you discuss any questions you have with your health care provider. ° °Nutrition for the New Mother  °A new mother needs good health and nutrition so she can have energy to take care of a new baby. Whether a mother breastfeeds or formula feeds the baby, it is important to have a well-balanced diet. Foods from all the food groups should be chosen to meet the new mother's energy needs and to give her the nutrients needed for repair and healing.  °A HEALTHY EATING PLAN °The My [employer] plan for Moms outlines what you should eat to help you and your baby stay healthy. The energy and amount of food you need depends on whether or not you are breastfeeding. If you are breastfeeding you will need more nutrients. If you choose not to breastfeed, your nutrition goal should be to return to a healthy weight. Limiting calories may be needed if you are not breastfeeding.  °HOME CARE INSTRUCTIONS  °· For a personal plan based on your unique needs, see your Registered Dietitian or visit www.mypyramid.gov. °· Eat a variety of foods. The plan below will help guide you. The following chart has a suggested daily meal plan from the [employer] for Moms. °· Eat a variety of fruits and vegetables. °· Eat more dark green and orange vegetables and cooked dried beans. °· Make half your grains whole grains. Choose whole instead of refined grains. °· Choose low-fat or lean meats and poultry. °· Choose low-fat or fat-free dairy products like milk, cheese, or yogurt. °Fruits °· Breastfeeding: 2 cups °· Non-Breastfeeding: 2 cups °· What Counts as a serving? °¨ 1 cup of fruit or juice. °¨ ½ cup dried fruit. °Vegetables °· Breastfeeding: 3 cups °· Non-Breastfeeding: 2 ½ cups °· What Counts as a serving? °¨ 1 cup raw or cooked vegetables. °¨ Juice or 2 cups raw leafy  vegetables. °Grains °· Breastfeeding: 8 oz °· Non-Breastfeeding: 6 oz °· What Counts as a serving? °¨ 1 slice bread. °¨ 1 oz ready-to-eat cereal. °¨ ½ cup cooked pasta, rice, or cereal. °Meat and Beans °· Breastfeeding: 6 ½ oz °· Non-Breastfeeding: 5 ½ oz °· What Counts as a serving? °¨ 1 oz lean meat, poultry, or fish °¨ ¼ cup cooked dry beans °¨ ½ oz nuts or 1 egg °¨ 1 tbs peanut butter °Milk °· Breastfeeding: 3 cups °· Non-Breastfeeding: 3 cups °· What Counts as a serving? °¨ 1 cup milk. °¨ 8 oz yogurt. °¨ 1 ½ oz cheese. °¨ 2 oz processed cheese. °TIPS FOR THE BREASTFEEDING MOM °· Rapid weight   loss is not suggested when you are breastfeeding. By simply breastfeeding, you will be able to lose the weight gained during your pregnancy. Your caregiver can keep track of your weight and tell you if your weight loss is appropriate. °· Be sure to drink fluids. You may notice that you are thirstier than usual. A suggestion is to drink a glass of water or other beverage whenever you breastfeed. °· Avoid alcohol as it can be passed into your breast milk. °· Limit caffeine drinks to no more than 2 to 3 cups per day. °· You may need to keep taking your prenatal vitamin while you are breastfeeding. Talk with your caregiver about taking a vitamin or supplement. °RETURING TO A HEALTHY WEIGHT °· The My [employer] Plan for Moms will help you return to a healthy weight. It will also provide the nutrients you need. °· You may need to limit "empty" calories. These include: °¨ High fat foods like fried foods, fatty meats, fast food, butter, and mayonnaise. °¨ High sugar foods like sodas, jelly, candy, and sweets. °· Be physically active. Include 30 minutes of exercise or more each day. Choose an activity you like such as walking, swimming, biking, or aerobics. Check with your caregiver before you start to exercise. °Document Released: 06/21/2007 Document Revised: 06/06/2011 Document Reviewed: 06/21/2007 °ExitCare® Patient Information  ©2015 [employer]. This information is not intended to replace advice given to you by your health care provider. Make sure you discuss any questions you have with your health care provider. °Postpartum Depression and Baby Blues °The postpartum period begins right after the birth of a baby. During this time, there is often a great amount of joy and excitement. It is also a time of many changes in the life of the parents. Regardless of how many times a mother gives birth, each child brings new challenges and dynamics to the family. It is not unusual to have feelings of excitement along with confusing shifts in moods, emotions, and thoughts. All mothers are at risk of developing postpartum depression or the "baby blues." These mood changes can occur right after giving birth, or they may occur many months after giving birth. The baby blues or postpartum depression can be mild or severe. Additionally, postpartum depression can go away rather quickly, or it can be a long-term condition.  °CAUSES °Raised hormone levels and the rapid drop in those levels are thought to be a main cause of postpartum depression and the baby blues. A number of hormones change during and after pregnancy. Estrogen and progesterone usually decrease right after the delivery of your baby. The levels of thyroid hormone and various cortisol steroids also rapidly drop. Other factors that play a role in these mood changes include major life events and genetics.  °RISK FACTORS °If you have any of the following risks for the baby blues or postpartum depression, know what symptoms to watch out for during the postpartum period. Risk factors that may increase the likelihood of getting the baby blues or postpartum depression include: °· Having a personal or family history of depression.   °· Having depression while being pregnant.   °· Having premenstrual mood issues or mood issues related to oral contraceptives. °· Having a lot of life stress.   °· Having  marital conflict.   °· Lacking a social support network.   °· Having a baby with special needs.   °· Having health problems, such as diabetes.   °SIGNS AND SYMPTOMS °Symptoms of baby blues include: °· Brief changes in mood, such as going   from extreme happiness to sadness. °· Decreased concentration.   °· Difficulty sleeping.   °· Crying spells, tearfulness.   °· Irritability.   °· Anxiety.   °Symptoms of postpartum depression typically begin within the first month after giving birth. These symptoms include: °· Difficulty sleeping or excessive sleepiness.   °· Marked weight loss.   °· Agitation.   °· Feelings of worthlessness.   °· Lack of interest in activity or food.   °Postpartum psychosis is a very serious condition and can be dangerous. Fortunately, it is rare. Displaying any of the following symptoms is cause for immediate medical attention. Symptoms of postpartum psychosis include:  °· Hallucinations and delusions.   °· Bizarre or disorganized behavior.   °· Confusion or disorientation.   °DIAGNOSIS  °A diagnosis is made by an evaluation of your symptoms. There are no medical or lab tests that lead to a diagnosis, but there are various questionnaires that a health care provider may use to identify those with the baby blues, postpartum depression, or psychosis. Often, a screening tool called the Edinburgh Postnatal Depression Scale is used to diagnose depression in the postpartum period.  °TREATMENT °The baby blues usually goes away on its own in 1-2 weeks. Social support is often all that is needed. You will be encouraged to get adequate sleep and rest. Occasionally, you may be given medicines to help you sleep.  °Postpartum depression requires treatment because it can last several months or longer if it is not treated. Treatment may include individual or group therapy, medicine, or both to address any social, physiological, and psychological factors that may play a role in the depression. Regular exercise, a  healthy diet, rest, and social support may also be strongly recommended.  °Postpartum psychosis is more serious and needs treatment right away. Hospitalization is often needed. °HOME CARE INSTRUCTIONS °· Get as much rest as you can. Nap when the baby sleeps.   °· Exercise regularly. Some women find yoga and walking to be beneficial.   °· Eat a balanced and nourishing diet.   °· Do little things that you enjoy. Have a cup of tea, take a bubble bath, read your favorite magazine, or listen to your favorite music. °· Avoid alcohol.   °· Ask for help with household chores, cooking, grocery shopping, or running errands as needed. Do not try to do everything.   °· Talk to people close to you about how you are feeling. Get support from your partner, family members, friends, or other new moms. °· Try to stay positive in how you think. Think about the things you are grateful for.   °· Do not spend a lot of time alone.   °· Only take over-the-counter or prescription medicine as directed by your health care provider. °· Keep all your postpartum appointments.   °· Let your health care provider know if you have any concerns.   °SEEK MEDICAL CARE IF: °You are having a reaction to or problems with your medicine. °SEEK IMMEDIATE MEDICAL CARE IF: °· You have suicidal feelings.   °· You think you may harm the baby or someone else. °MAKE SURE YOU: °· Understand these instructions. °· Will watch your condition. °· Will get help right away if you are not doing well or get worse. °Document Released: 12/17/2003 Document Revised: 03/19/2013 Document Reviewed: 12/24/2012 °Exit[employer]® Patient Information ©2015 [employer]. This information is not intended to replace advice given to you by your health care provider. Make sure you discuss any questions you have with your health care provider. °Breastfeeding and Mastitis °Mastitis is inflammation of the breast tissue. It can occur in women who   are breastfeeding. This can make breastfeeding  painful. Mastitis will sometimes go away on its own. Your health care provider will help determine if treatment is needed. °CAUSES °Mastitis is often associated with a blocked milk (lactiferous) duct. This can happen when too much milk builds up in the breast. Causes of excess milk in the breast can include: °· Poor latch-on. If your baby is not latched onto the breast properly, she or he may not empty your breast completely while breastfeeding. °· Allowing too much time to pass between feedings. °· Wearing a bra or other clothing that is too tight. This puts extra pressure on the lactiferous ducts so milk does not flow through them as it should. °Mastitis can also be caused by a bacterial infection. Bacteria may enter the breast tissue through cuts or openings in the skin. In women who are breastfeeding, this may occur because of cracked or irritated skin. Cracks in the skin are often caused when your baby does not latch on properly to the breast. °SIGNS AND SYMPTOMS °· Swelling, redness, tenderness, and pain in an area of the breast. °· Swelling of the glands under the arm on the same side. °· Fever may or may not accompany mastitis. °If an infection is allowed to progress, a collection of pus (abscess) may develop. °DIAGNOSIS  °Your health care provider can usually diagnose mastitis based on your symptoms and a physical exam. Tests may be done to help confirm the diagnosis. These may include: °· Removal of pus from the breast by applying pressure to the area. This pus can be examined in the lab to determine which bacteria are present. If an abscess has developed, the fluid in the abscess can be removed with a needle. This can also be used to confirm the diagnosis and determine the bacteria present. In most cases, pus will not be present. °· Blood tests to determine if your body is fighting a bacterial infection. °· Mammogram or ultrasound tests to rule out other problems or diseases. °TREATMENT  °Mastitis that  occurs with breastfeeding will sometimes go away on its own. Your health care provider may choose to wait 24 hours after first seeing you to decide whether a prescription medicine is needed. If your symptoms are worse after 24 hours, your health care provider will likely prescribe an antibiotic medicine to treat the mastitis. He or she will determine which bacteria are most likely causing the infection and will then select an appropriate antibiotic medicine. This is sometimes changed based on the results of tests performed to identify the bacteria, or if there is no response to the antibiotic medicine selected. Antibiotic medicines are usually given by mouth. You may also be given medicine for pain. °HOME CARE INSTRUCTIONS °· Only take over-the-counter or prescription medicines for pain, fever, or discomfort as directed by your health care provider. °· If your health care provider prescribed an antibiotic medicine, take the medicine as directed. Make sure you finish it even if you start to feel better. °· Do not wear a tight or underwire bra. Wear a soft, supportive bra. °· Increase your fluid intake, especially if you have a fever. °· Continue to empty the breast. Your health care provider can tell you whether this milk is safe for your infant or needs to be thrown out. You may be told to stop nursing until your health care provider thinks it is safe for your baby. Use a breast pump if you are advised to stop nursing. °· Keep your nipples   clean and dry. °· Empty the first breast completely before going to the other breast. If your baby is not emptying your breasts completely for some reason, use a breast pump to empty your breasts. °· If you go back to work, pump your breasts while at work to stay in time with your nursing schedule. °· Avoid allowing your breasts to become overly filled with milk (engorged). °SEEK MEDICAL CARE IF: °· You have pus-like discharge from the breast. °· Your symptoms do not improve with  the treatment prescribed by your health care provider within 2 days. °SEEK IMMEDIATE MEDICAL CARE IF: °· Your pain and swelling are getting worse. °· You have pain that is not controlled with medicine. °· You have a red line extending from the breast toward your armpit. °· You have a fever or persistent symptoms for more than 2-3 days. °· You have a fever and your symptoms suddenly get worse. °MAKE SURE YOU:  °· Understand these instructions. °· Will watch your condition. °· Will get help right away if you are not doing well or get worse. °Document Released: 07/09/2004 Document Revised: 03/19/2013 Document Reviewed: 10/18/2012 °[employer]® Patient Information ©2015 [employer]. This information is not intended to replace advice given to you by your health care provider. Make sure you discuss any questions you have with your health care provider. °Breastfeeding °Deciding to breastfeed is one of the best choices you can make for you and your baby. A change in hormones during pregnancy causes your breast tissue to grow and increases the number and size of your milk ducts. These hormones also allow proteins, sugars, and fats from your blood supply to make breast milk in your milk-producing glands. Hormones prevent breast milk from being released before your baby is born as well as prompt milk flow after birth. Once breastfeeding has begun, thoughts of your baby, as well as his or her sucking or crying, can stimulate the release of milk from your milk-producing glands.  °BENEFITS OF BREASTFEEDING °For Your Baby °· Your first milk (colostrum) helps your baby's digestive system function better.   °· There are antibodies in your milk that help your baby fight off infections.   °· Your baby has a lower incidence of asthma, allergies, and sudden infant death syndrome.   °· The nutrients in breast milk are better for your baby than infant formulas and are designed uniquely for your baby's needs.   °· Breast milk improves your  baby's brain development.   °· Your baby is less likely to develop other conditions, such as childhood obesity, asthma, or type 2 diabetes mellitus.   °For You  °· Breastfeeding helps to create a very special bond between you and your baby.   °· Breastfeeding is convenient. Breast milk is always available at the correct temperature and costs nothing.   °· Breastfeeding helps to burn calories and helps you lose the weight gained during pregnancy.   °· Breastfeeding makes your uterus contract to its prepregnancy size faster and slows bleeding (lochia) after you give birth.   °· Breastfeeding helps to lower your risk of developing type 2 diabetes mellitus, osteoporosis, and breast or ovarian cancer later in life. °SIGNS THAT YOUR BABY IS HUNGRY °Early Signs of Hunger  °· Increased alertness or activity. °· Stretching. °· Movement of the head from side to side. °· Movement of the head and opening of the mouth when the corner of the mouth or cheek is stroked (rooting). °· Increased sucking sounds, smacking lips, cooing, sighing, or squeaking. °· Hand-to-mouth movements. °· Increased sucking of   fingers or hands. °Late Signs of Hunger °· Fussing. °· Intermittent crying. °Extreme Signs of Hunger °Signs of extreme hunger will require calming and consoling before your baby will be able to breastfeed successfully. Do not wait for the following signs of extreme hunger to occur before you initiate breastfeeding:   °· Restlessness. °· A loud, strong cry. °·  Screaming. °BREASTFEEDING BASICS °Breastfeeding Initiation °· Find a comfortable place to sit or lie down, with your neck and back well supported. °· Place a pillow or rolled up blanket under your baby to bring him or her to the level of your breast (if you are seated). Nursing pillows are specially designed to help support your arms and your baby while you breastfeed. °· Make sure that your baby's abdomen is facing your abdomen.   °· Gently massage your breast. With your  fingertips, massage from your chest wall toward your nipple in a circular motion. This encourages milk flow. You may need to continue this action during the feeding if your milk flows slowly. °· Support your breast with 4 fingers underneath and your thumb above your nipple. Make sure your fingers are well away from your nipple and your baby's mouth.   °· Stroke your baby's lips gently with your finger or nipple.   °· When your baby's mouth is open wide enough, quickly bring your baby to your breast, placing your entire nipple and as much of the colored area around your nipple (areola) as possible into your baby's mouth.   °¨ More areola should be visible above your baby's upper lip than below the lower lip.   °¨ Your baby's tongue should be between his or her lower gum and your breast.   °· Ensure that your baby's mouth is correctly positioned around your nipple (latched). Your baby's lips should create a seal on your breast and be turned out (everted). °· It is common for your baby to suck about 2-3 minutes in order to start the flow of breast milk. °Latching °Teaching your baby how to latch on to your breast properly is very important. An improper latch can cause nipple pain and decreased milk supply for you and poor weight gain in your baby. Also, if your baby is not latched onto your nipple properly, he or she may swallow some air during feeding. This can make your baby fussy. Burping your baby when you switch breasts during the feeding can help to get rid of the air. However, teaching your baby to latch on properly is still the best way to prevent fussiness from swallowing air while breastfeeding. °Signs that your baby has successfully latched on to your nipple:    °· Silent tugging or silent sucking, without causing you pain.   °· Swallowing heard between every 3-4 sucks.   °·  Muscle movement above and in front of his or her ears while sucking.   °Signs that your baby has not successfully latched on to  nipple:  °· Sucking sounds or smacking sounds from your baby while breastfeeding. °· Nipple pain. °If you think your baby has not latched on correctly, slip your finger into the corner of your baby's mouth to break the suction and place it between your baby's gums. Attempt breastfeeding initiation again. °Signs of Successful Breastfeeding °Signs from your baby:   °· A gradual decrease in the number of sucks or complete cessation of sucking.   °· Falling asleep.   °· Relaxation of his or her body.   °· Retention of a small amount of milk in his or her mouth.   °· Letting go   of your breast by himself or herself. °Signs from you: °· Breasts that have increased in firmness, weight, and size 1-3 hours after feeding.   °· Breasts that are softer immediately after breastfeeding. °· Increased milk volume, as well as a change in milk consistency and color by the fifth day of breastfeeding.   °· Nipples that are not sore, cracked, or bleeding. °Signs That Your Baby is Getting Enough Milk °· Wetting at least 3 diapers in a 24-hour period. The urine should be clear and pale yellow by age 5 days. °· At least 3 stools in a 24-hour period by age 5 days. The stool should be soft and yellow. °· At least 3 stools in a 24-hour period by age 7 days. The stool should be seedy and yellow. °· No loss of weight greater than 10% of birth weight during the first 3 days of age. °· Average weight gain of 4-7 ounces (113-198 g) per week after age 4 days. °· Consistent daily weight gain by age 5 days, without weight loss after the age of 2 weeks. °After a feeding, your baby may spit up a small amount. This is common. °BREASTFEEDING FREQUENCY AND DURATION °Frequent feeding will help you make more milk and can prevent sore nipples and breast engorgement. Breastfeed when you feel the need to reduce the fullness of your breasts or when your baby shows signs of hunger. This is called "breastfeeding on demand." Avoid introducing a pacifier to your  baby while you are working to establish breastfeeding (the first 4-6 weeks after your baby is born). After this time you may choose to use a pacifier. Research has shown that pacifier use during the first year of a baby's life decreases the risk of sudden infant death syndrome (SIDS). °Allow your baby to feed on each breast as long as he or she wants. Breastfeed until your baby is finished feeding. When your baby unlatches or falls asleep while feeding from the first breast, offer the second breast. Because newborns are often sleepy in the first few weeks of life, you may need to awaken your baby to get him or her to feed. °Breastfeeding times will vary from baby to baby. However, the following rules can serve as a guide to help you ensure that your baby is properly fed: °· Newborns (babies 4 weeks of age or younger) may breastfeed every 1-3 hours. °· Newborns should not go longer than 3 hours during the day or 5 hours during the night without breastfeeding. °· You should breastfeed your baby a minimum of 8 times in a 24-hour period until you begin to introduce solid foods to your baby at around 6 months of age. °BREAST MILK PUMPING °Pumping and storing breast milk allows you to ensure that your baby is exclusively fed your breast milk, even at times when you are unable to breastfeed. This is especially important if you are going back to work while you are still breastfeeding or when you are not able to be present during feedings. Your lactation consultant can give you guidelines on how long it is safe to store breast milk.  °A breast pump is a machine that allows you to pump milk from your breast into a sterile bottle. The pumped breast milk can then be stored in a refrigerator or freezer. Some breast pumps are operated by hand, while others use electricity. Ask your lactation consultant which type will work best for you. Breast pumps can be purchased, but some hospitals and breastfeeding support groups   lease  breast pumps on a monthly basis. A lactation consultant can teach you how to hand express breast milk, if you prefer not to use a pump.  CARING FOR YOUR BREASTS WHILE YOU BREASTFEED Nipples can become dry, cracked, and sore while breastfeeding. The following recommendations can help keep your breasts moisturized and healthy:  Avoid using soap on your nipples.   Wear a supportive bra. Although not required, special nursing bras and tank tops are designed to allow access to your breasts for breastfeeding without taking off your entire bra or top. Avoid wearing underwire-style bras or extremely tight bras.  Air dry your nipples for 3-6minutes after each feeding.   Use only cotton bra pads to absorb leaked breast milk. Leaking of breast milk between feedings is normal.   Use lanolin on your nipples after breastfeeding. Lanolin helps to maintain your skin's normal moisture barrier. If you use pure lanolin, you do not need to wash it off before feeding your baby again. Pure lanolin is not toxic to your baby. You may also hand express a few drops of breast milk and gently massage that milk into your nipples and allow the milk to air dry. In the first few weeks after giving birth, some women experience extremely full breasts (engorgement). Engorgement can make your breasts feel heavy, warm, and tender to the touch. Engorgement peaks within 3-5 days after you give birth. The following recommendations can help ease engorgement:  Completely empty your breasts while breastfeeding or pumping. You may want to start by applying warm, moist heat (in the shower or with warm water-soaked hand towels) just before feeding or pumping. This increases circulation and helps the milk flow. If your baby does not completely empty your breasts while breastfeeding, pump any extra milk after he or she is finished.  Wear a snug bra (nursing or regular) or tank top for 1-2 days to signal your body to slightly decrease milk  production.  Apply ice packs to your breasts, unless this is too uncomfortable for you.  Make sure that your baby is latched on and positioned properly while breastfeeding. If engorgement persists after 48 hours of following these recommendations, contact your health care provider or a Science writer. OVERALL HEALTH CARE RECOMMENDATIONS WHILE BREASTFEEDING  Eat healthy foods. Alternate between meals and snacks, eating 3 of each per day. Because what you eat affects your breast milk, some of the foods may make your baby more irritable than usual. Avoid eating these foods if you are sure that they are negatively affecting your baby.  Drink milk, fruit juice, and water to satisfy your thirst (about 10 glasses a day).   Rest often, relax, and continue to take your prenatal vitamins to prevent fatigue, stress, and anemia.  Continue breast self-awareness checks.  Avoid chewing and smoking tobacco.  Avoid alcohol and drug use. Some medicines that may be harmful to your baby can pass through breast milk. It is important to ask your health care provider before taking any medicine, including all over-the-counter and prescription medicine as well as vitamin and herbal supplements. It is possible to become pregnant while breastfeeding. If birth control is desired, ask your health care provider about options that will be safe for your baby. SEEK MEDICAL CARE IF:   You feel like you want to stop breastfeeding or have become frustrated with breastfeeding.  You have painful breasts or nipples.  Your nipples are cracked or bleeding.  Your breasts are red, tender, or warm.  You have  a swollen area on either breast.  You have a fever or chills.  You have nausea or vomiting.  You have drainage other than breast milk from your nipples.  Your breasts do not become full before feedings by the fifth day after you give birth.  You feel sad and depressed.  Your baby is too sleepy to eat  well.  Your baby is having trouble sleeping.   Your baby is wetting less than 3 diapers in a 24-hour period.  Your baby has less than 3 stools in a 24-hour period.  Your baby's skin or the white part of his or her eyes becomes yellow.   Your baby is not gaining weight by 16 days of age. SEEK IMMEDIATE MEDICAL CARE IF:   Your baby is overly tired (lethargic) and does not want to wake up and feed.  Your baby develops an unexplained fever. Document Released: 03/14/2005 Document Revised: 03/19/2013 Document Reviewed: 09/05/2012 [facility] Patient Information 2015 [place], [place]. This information is not intended to replace advice given to you by your health care provider. Make sure you discuss any questions you have with your health care provider.  Breast Pumping Tips If you are breastfeeding, there may be times when you cannot feed your baby directly. Returning to work or going on a trip are common examples. Pumping allows you to store breast milk and feed it to your baby later.  You may not get much milk when you first start to pump. Your breasts should start to make more after a few days. If you pump at the times you usually feed your baby, you may be able to keep making enough milk to feed your baby without also using formula. The more often you pump, the more milk you will produce. WHEN SHOULD I PUMP?   You can begin to pump soon after delivery. However, some experts recommend waiting about 4 weeks before giving your infant a bottle to make sure breastfeeding is going well.  If you plan to return to work, begin pumping a few weeks before. This will help you develop techniques that work best for you. It also lets you build up a supply of breast milk.   When you are with your infant, feed on demand and pump after each feeding.   When you are away from your infant for several hours, pump for about 15 minutes every 2-3 hours. Pump both breasts at the same time if you can.   If your  infant has a formula feeding, make sure to pump around the same time.   If you drink any alcohol, wait 2 hours before pumping.  HOW DO I PREPARE TO PUMP? Your let-down reflexis the natural reaction to stimulation that makes your breast milk flow. It is easier to stimulate this reflex when you are relaxed. Find relaxation techniques that work for you. If you have difficulty with your let-down reflex, try these methods:   Smell one of your infant's blankets or an item of clothing.   Look at a picture or video of your infant.   Sit in a quiet, private space.   Massage the breast you plan to pump.   Place soothing warmth on the breast.   Play relaxing music.  WHAT ARE SOME GENERAL BREAST PUMPING TIPS?  Wash your hands before you pump. You do not need to wash your nipples or breasts.  There are three ways to pump.  You can use your hand to massage and compress your breast.  You can use a handheld manual pump.  You can use an electric pump.   Make sure the suction cup (flange) on the breast pump is the right size. Place the flange directly over the nipple. If it is the wrong size or placed the wrong way, it may be painful and cause nipple damage.   If pumping is uncomfortable, apply a small amount of purified or modified lanolin to your nipple and areola.  If you are using an electric pump, adjust the speed and suction power to be more comfortable.  If pumping is painful or if you are not getting very much milk, you may need a different type of pump. A lactation consultant can help you determine what type of pump to use.   Keep a full water bottle near you at all times. Drinking lots of fluid helps you make more milk.  You can store your milk to use later. Pumped breast milk can be stored in a sealable, sterile container or plastic bag. Label all stored breast milk with the date you pumped it.  Milk can stay out at room temperature for up to 8 hours.  You can store  your milk in the refrigerator for up to 8 days.  You can store your milk in the freezer for 3 months. Thaw frozen milk using warm water. Do not put it in the microwave.  Do not smoke. Smoking can lower your milk supply and harm your infant. If you need help quitting, ask your health care provider to recommend a program.  WHEN SHOULD I CALL MY HEALTH CARE PROVIDER OR A LACTATION CONSULTANT?  You are having trouble pumping.  You are concerned that you are not making enough milk.  You have nipple pain, soreness, or redness.  You want to use birth control. Birth control pills may lower your milk supply. Talk to your health care provider about your options. Document Released: 09/01/2009 Document Revised: 03/19/2013 Document Reviewed: 01/04/2013 [facility] Patient Information 2015 [place], [place]. This information is not intended to replace advice given to you by your health care provider. Make sure you discuss any questions you have with your health care provider.  Nutrition for the New Mother  A new mother needs good health and nutrition so she can have energy to take care of a new baby. Whether a mother breastfeeds or formula feeds the baby, it is important to have a well-balanced diet. Foods from all the food groups should be chosen to meet the new mother's energy needs and to give her the nutrients needed for repair and healing.  A HEALTHY EATING PLAN The My Pyramid plan for Moms outlines what you should eat to help you and your baby stay healthy. The energy and amount of food you need depends on whether or not you are breastfeeding. If you are breastfeeding you will need more nutrients. If you choose not to breastfeed, your nutrition goal should be to return to a healthy weight. Limiting calories may be needed if you are not breastfeeding.  HOME CARE INSTRUCTIONS   For a personal plan based on your unique needs, see your Registered Dietitian or visit collegescenetv.comwww.mypyramid.gov.  Eat a variety of  foods. The plan below will help guide you. The following chart has a suggested daily meal plan from the My Pyramid for Moms.  Eat a variety of fruits and vegetables.  Eat more dark green and orange vegetables and cooked dried beans.  Make half your grains whole grains. Choose whole instead of  refined grains.  Choose low-fat or lean meats and poultry.  Choose low-fat or fat-free dairy products like milk, cheese, or yogurt. Fruits  Breastfeeding: 2 cups  Non-Breastfeeding: 2 cups  What Counts as a serving?  1 cup of fruit or juice.   cup dried fruit. Vegetables  Breastfeeding: 3 cups  Non-Breastfeeding: 2  cups  What Counts as a serving?  1 cup raw or cooked vegetables.  Juice or 2 cups raw leafy vegetables. Grains  Breastfeeding: 8 oz  Non-Breastfeeding: 6 oz  What Counts as a serving?  1 slice bread.  1 oz ready-to-eat cereal.   cup cooked pasta, rice, or cereal. Meat and Beans  Breastfeeding: 6  oz  Non-Breastfeeding: 5  oz  What Counts as a serving?  1 oz lean meat, poultry, or fish   cup cooked dry beans   oz nuts or 1 egg  1 tbs peanut butter Milk  Breastfeeding: 3 cups  Non-Breastfeeding: 3 cups  What Counts as a serving?  1 cup milk.  8 oz yogurt.  1  oz cheese.  2 oz processed cheese. TIPS FOR THE BREASTFEEDING MOM  Rapid weight loss is not suggested when you are breastfeeding. By simply breastfeeding, you will be able to lose the weight gained during your pregnancy. Your caregiver can keep track of your weight and tell you if your weight loss is appropriate.  Be sure to drink fluids. You may notice that you are thirstier than usual. A suggestion is to drink a glass of water or other beverage whenever you breastfeed.  Avoid alcohol as it can be passed into your breast milk.  Limit caffeine drinks to no more than 2 to 3 cups per day.  You may need to keep taking your prenatal vitamin while you are breastfeeding. Talk  with your caregiver about taking a vitamin or supplement. RETURING TO A HEALTHY WEIGHT  The My Pyramid Plan for Moms will help you return to a healthy weight. It will also provide the nutrients you need.  You may need to limit "empty" calories. These include:  High fat foods like fried foods, fatty meats, fast food, butter, and mayonnaise.  High sugar foods like sodas, jelly, candy, and sweets.  Be physically active. Include 30 minutes of exercise or more each day. Choose an activity you like such as walking, swimming, biking, or aerobics. Check with your caregiver before you start to exercise. Document Released: 06/21/2007 Document Revised: 06/06/2011 Document Reviewed: 06/21/2007 [facility] Patient Information 2015 [place], [place]. This information is not intended to replace advice given to you by your health care provider. Make sure you discuss any questions you have with your health care provider.

## 2014-01-19 NOTE — Discharge Summary (Signed)
POSTOPERATIVE DISCHARGE SUMMARY:  Patient ID: Taylor Rangel MRN: [id number] DOB/AGE: 26/04/1985 26 y.o.  Admit date: 01/17/2014 Admission Diagnoses: Cesarean Delivery d/t breech presentation and oligohydramnios   Discharge date:  01/19/2014 Discharge Diagnoses: S/P Primary C/S due to breech presentation and oligohydramnios on 01/17/2014        Prenatal history: G1P1001   EDC : 02/02/2014, by Last Menstrual Period  [facility] 9.[redacted] wks gestation. Primary provider : Dr. Juliene PinaMody Prenatal course complicated by [facility], breech presentation, oligohydramnios  Prenatal Labs: ABO, Rh: --/--/B POS (10/22 1250)  Antibody: NEG (10/22 1248) Rubella:  Immune   RPR: NON REAC (10/22 1248)  HBsAg: Negative (04/06 0000)  HIV: Non-reactive (04/06 0000)  GTT : normal GBS: Negative (10/13 0000)   Medical / Surgical History :  Past medical history: History reviewed. No pertinent past medical history.  Past surgical history:  Past Surgical History  Procedure Laterality Date  . No past surgeries       Allergies: Review of patient's allergies indicates no known allergies.   Intrapartum Course:  Admited for scheduled cesarean delivery d/t breech presentation and oligohydramnios / surgery by Dr. Juliene PinaMody / see operative note for details  Physical Exam:   VSS: Blood pressure 107/62, pulse 76, temperature 97.9 F (36.6 C), temperature source Oral, resp. rate 16, height 4\' 10"  (1.473 m), weight 62.143 kg (137 lb), last menstrual period 04/28/2013, SpO2 100.00%, if currently breastfeeding.  LABS:  Recent Labs  01/16/14 1249 01/18/14 0607  WBC 8.2 7.9  HGB 12.1 10.7*  PLT 239 183    Newborn Data Live born female on 01/17/2014 Birth Weight: 5 lb 9 oz (2523 g) APGAR: 8, 9  See operative report for further details  Home with mother.  Discharge Instructions:  Wound Care: keep clean and dry / remove honeycomb POD 7 Postpartum Instructions:  [facility] discharge booklet - instructions reviewed Medications:    Medication List         ibuprofen 600 MG tablet  Commonly known as:  ADVIL,MOTRIN  Take 1 tablet (600 mg total) by mouth every 6 (six) hours.     oxyCODONE-acetaminophen 5-325 MG per tablet  Commonly known as:  PERCOCET/ROXICET  Take 1 tablet by mouth every 4 (four) hours as needed (for pain scale less than 7).     prenatal multivitamin Tabs tablet  Take 1 tablet by mouth daily at 12 noon.           Follow-up Information   Follow up with MODY,VAISHALI R, MD. Schedule an appointment as soon as possible for a visit in 6 weeks. (postpartum visit)    Specialty:  Obstetrics and Gynecology   Contact information:   [address] [place] [id number] [phone]         Signed: Kenard GowerAWSON, Sybrina Laning, M, MSN, CNM 01/19/2014, 11:30 AM

## 2014-01-19 NOTE — Progress Notes (Signed)
Patient ID: Taylor Rangel, female   DOB: 12/02/1985, 28 y.o.   MRN: [id number] Subjective: POD# 2 Information for the patient's newborn:  Taylor Rangel, Taylor Rangel [[id number]]  female    Reports feeling tired and a little sore Feeding: breast and bottle - formula supplementation Patient reports tolerating PO.  Breast symptoms: very little colostrum Pain controlled with ibuprofen (OTC) and narcotic analgesics including Percocet Denies HA/SOB/C/P/N/V/dizziness. Flatus present. No BM. She reports vaginal bleeding as normal, without clots.  She is ambulating, urinating without difficult.     Objective:   VS:  Filed Vitals:   01/18/14 0504 01/18/14 0900 01/18/14 1900 01/19/14 0510  BP: 97/53 92/50 105/56 104/65  Pulse:  77 82 85  Temp:  98.4 F (36.9 C) 98.2 F (36.8 C) 98.8 F (37.1 C)  TempSrc:  Oral Oral Oral  Resp:  18 18 17   Height:      Weight:      SpO2:   99% 100%    No intake or output data in the 24 hours ending 01/19/14 0756       Recent Labs  01/16/14 1249 01/18/14 0607  WBC 8.2 7.9  HGB 12.1 10.7*  HCT 36.3 32.4*  PLT 239 183     Blood type: --/--/B POS (10/22 1250)  Rubella:  Immune    Physical Exam:  General: alert, cooperative, fatigued and no distress Abdomen: soft, nontender, hypoactive bowel sounds Incision: dried serosanguineous on 75% of Honeycomb dressing drainage present Uterine Fundus: firm, 1 FB below umbilicus, nontender Lochia: minimal Ext: extremities normal, atraumatic, no cyanosis or edema, Homans sign is negative, no sign of DVT and no edema, redness or tenderness in the calves or thighs   Assessment/Plan: 28 y.o.   POD# 2.  s/p Cesarean Delivery.  Indications: breech and oligohydramnios                Principal Problem:   Postpartum care following cesarean delivery (10/23) Active Problems:   Breech presentation of fetus   Oligohydramnios in third trimester, antepartum   Cesarean delivery delivered  Doing well, stable.         Regular diet as tolerated Ambulate Routine post-op care Anticipate D/C home tomorrow  Taylor Rangel, Taylor Rangel, Taylor Rangel, Taylor Rangel, Taylor Rangel 01/19/2014,7:56 AM

## 2014-01-19 NOTE — Lactation Note (Signed)
This note was copied from the chart of Taylor Marcella Wolters. Lactation Consultation Note  Patient Name: Taylor Rangel FAOZH'YToday's Date: 01/19/2014 Reason for consult: Follow-up assessment;Late preterm infant;Infant < 6lbs Baby is now mostly bottle-feeding with formula and drops of ebm, and mom states she is continuing to pump with DEBP every 3hours but only seeing drops.  LC encouraged her to continue regular pumping and latching baby when able. Baby tolerating up to 20 ml's per feeding now.   Maternal Data    Feeding    LATCH Score/Interventions           no breastfeeding attempts today; mom pumping           Lactation Tools Discussed/Used   DEBP   Consult Status Consult Status: Follow-up Date: 01/20/14 Follow-up type: In-patient    [name], Mahmoud Blazejewski [facility] 01/19/2014, 10:07 PM

## 2014-01-20 ENCOUNTER — Encounter (HOSPITAL_COMMUNITY): Payer: Self-pay | Admitting: Obstetrics & Gynecology

## 2014-01-20 ENCOUNTER — Ambulatory Visit: Payer: Self-pay

## 2014-01-20 NOTE — Discharge Summary (Signed)
Reviewed and agree with note and plan. V.Morgan Rennert, MD  

## 2014-01-20 NOTE — Lactation Note (Addendum)
This note was copied from the chart of Taylor Krysia Kleman. Lactation Consultation Note  Patient Name: Taylor Rangel EOFHQ'R Date: 01/20/2014 Reason for consult: Follow-up assessment;Late preterm infant;Infant < 6lbs;Other (Comment) Per mom plan to pump and bottle feed until the baby grows and can latch better. Also due to my flat nipples.  The nipple shield just wasn't working well. LC discussed and reviewed supply and demand and stressed the importance  Of consistent pumping about every 2-3 hours and when necessary to prevent engorgement. Reference Book  Baby and Me. Pg 24 and storage pg 25. Dad called the insurance company and it will be 7- 10 days for the arrival of a DEBP, in the mean time mom and dad  Willing to rent a insurance DEBP for 40.00. Reviewed set up of the DEBP and kit. Mom had been pumping in the hospital and per mom only getting drops. Per mom will be seeing Albesa Seen from [employer] tomorrow for Lactation . Mother informed of post-discharge support and given phone number to the lactation department, including services for phone call assistance; out-patient appointments;  and breastfeeding support group. List of other breastfeeding resources in the community given in the handout. Encouraged mother to call for problems or concerns related to breastfeeding.   Maternal Data    Feeding Feeding Type: Bottle Fed - Formula Nipple Type: Slow - flow  LATCH Score/Interventions                Intervention(s): Breastfeeding basics reviewed     Lactation Tools Discussed/Used     Consult Status Consult Status: Complete Date: 01/20/14 Follow-up type: In-patient    Myer Haff 01/20/2014, 9:58 AM

## 2014-01-27 ENCOUNTER — Encounter (HOSPITAL_COMMUNITY): Payer: Self-pay | Admitting: Obstetrics & Gynecology

## 2016-01-22 ENCOUNTER — Emergency Department (HOSPITAL_BASED_OUTPATIENT_CLINIC_OR_DEPARTMENT_OTHER)
Admission: EM | Admit: 2016-01-22 | Discharge: 2016-01-23 | Disposition: A | Discharge status: Home / Self Care | Payer: 59 | Attending: Emergency Medicine | Admitting: Emergency Medicine

## 2016-01-22 ENCOUNTER — Encounter (HOSPITAL_BASED_OUTPATIENT_CLINIC_OR_DEPARTMENT_OTHER): Payer: Self-pay

## 2016-01-22 DIAGNOSIS — A084 Viral intestinal infection, unspecified: Secondary | ICD-10-CM | POA: Insufficient documentation

## 2016-01-22 DIAGNOSIS — R112 Nausea with vomiting, unspecified: Secondary | ICD-10-CM | POA: Diagnosis present

## 2016-01-22 LAB — URINE MICROSCOPIC-ADD ON

## 2016-01-22 LAB — URINALYSIS, ROUTINE W REFLEX MICROSCOPIC
BILIRUBIN URINE: NEGATIVE
Glucose, UA: NEGATIVE mg/dL
HGB URINE DIPSTICK: NEGATIVE
Ketones, ur: NEGATIVE mg/dL
NITRITE: NEGATIVE
PROTEIN: NEGATIVE mg/dL
Specific Gravity, Urine: 1.006 (ref 1.005–1.030)
pH: 6 (ref 5.0–8.0)

## 2016-01-22 LAB — PREGNANCY, URINE: PREG TEST UR: NEGATIVE

## 2016-01-22 MED ORDER — ONDANSETRON 4 MG PO TBDP
4.0000 mg | ORAL_TABLET | Freq: Once | ORAL | Status: AC
  Administered 2016-01-22: 4 mg via ORAL
  Filled 2016-01-22: qty 1

## 2016-01-22 MED ORDER — ONDANSETRON HCL 4 MG/2ML IJ SOLN: 4.0000 mg | Freq: Once | INTRAMUSCULAR | Status: DC

## 2016-01-22 MED ORDER — SODIUM CHLORIDE 0.9 % IV BOLUS (SEPSIS)
1000.0000 mL | Freq: Once | INTRAVENOUS | Status: AC
  Administered 2016-01-22: 1000 mL via INTRAVENOUS

## 2016-01-22 NOTE — ED Notes (Signed)
C/o abd pain onset last pm w n/v/d  States vomited x 2

## 2016-01-22 NOTE — ED Triage Notes (Signed)
C/o vomiting and dizzy since last night-NAD-steady gait

## 2016-01-22 NOTE — ED Notes (Signed)
Pt has not vomited or had diarrhea since arrival to ed

## 2016-01-22 NOTE — ED Provider Notes (Signed)
[facility] Provider Note: Lowella DellJ. Lane Tniya Bowditch, MD, FACEP  CSN: [id number] MRN: [id number] ARRIVAL: 01/22/16 at 2150 ROOM: MH02/MH02   By signing my name below, I, Levon HedgerElizabeth Hall, attest that this documentation has been prepared under the direction and in the presence of Paula LibraJohn Shaterica Mcclatchy, MD . Electronically Signed: Levon HedgerElizabeth Hall, Scribe. 01/22/2016. 11:05 PM.   CHIEF COMPLAINT  Vomiting  HISTORY OF PRESENT ILLNESS  Taylor Rangel is a 30 y.o. female who presents to the Emergency Department complaining of nausea, vomiting and diarrhea onset yesterday evening. There is been associated crampy left upper quadrant pain. The pain has been mild to moderate. Her pain is alleviated after vomiting or moving her bowels. Vomiting is worse if she tries to eat or drink. She was given Zofran ODT by protocol on arrival. She told her nurse that her nausea was relieved. She tells me that her nausea is still present. She is having associated lightheadedness but denies fever, headache, CP or SOB.   History reviewed. No pertinent past medical history.  Past Surgical History:  Procedure Laterality Date  . CESAREAN SECTION N/A 01/17/2014   Procedure: Primary CESAREAN SECTION;  Surgeon: Robley FriesVaishali R Mody, MD;  Location: [facility];  Service: Obstetrics;  Laterality: N/A;  EDD: 02/02/14  . NO PAST SURGERIES     No family history on file.  Social History  Substance Use Topics  . Smoking status: Never Smoker  . Smokeless tobacco: Never Used  . Alcohol use No    Prior to Admission medications   Medication Sig Start Date End Date Taking? Authorizing Provider  ondansetron (ZOFRAN ODT) 8 MG disintegrating tablet Take 1 tablet (8 mg total) by mouth every 8 (eight) hours as needed for nausea or vomiting. 01/23/16   Paula LibraJohn Otilia Kareem, MD   Allergies Review of patient's allergies indicates no known allergies.  REVIEW OF SYSTEMS  Negative except as noted here or in the History of Present Illness.  PHYSICAL  EXAMINATION  Initial Vital Signs Blood pressure 107/84, pulse 96, temperature 98.1 F (36.7 C), temperature source Oral, resp. rate 18, height 5' (1.524 m), weight 101 lb (45.8 kg), last menstrual period 12/29/2015, SpO2 100 %, unknown if currently breastfeeding.  Examination General: Well-developed, well-nourished female in no acute distress; appearance consistent with age of record HENT: normocephalic; atraumatic Eyes: pupils equal, round and reactive to light; extraocular muscles intact Neck: supple Heart: regular rate and rhythm Lungs: clear to auscultation bilaterally Abdomen: soft; nondistended; mild LUQ tenderness; no masses or hepatosplenomegaly; bowel sounds present Extremities: No deformity; full range of motion; pulses normal Neurologic: Awake, alert and oriented; motor function intact in all extremities and symmetric; no facial droop Skin: Warm and dry Psychiatric: Normal mood and affect  RESULTS  Summary of this visit's results, reviewed by myself:   EKG Interpretation  Date/Time:    Ventricular Rate:    PR Interval:    QRS Duration:   QT Interval:    QTC Calculation:   R Axis:     Text Interpretation:        Laboratory Studies: Results for orders placed or performed during the hospital encounter of 01/22/16 (from the past 24 hour(s))  Urinalysis, Routine w reflex microscopic (not at [facility])     Status: Abnormal   Collection Time: 01/22/16  9:55 PM  Result Value Ref Range   Color, Urine YELLOW YELLOW   APPearance CLEAR CLEAR   Specific Gravity, Urine 1.006 1.005 - 1.030   pH 6.0 5.0 - 8.0   Glucose,  UA NEGATIVE NEGATIVE mg/dL   Hgb urine dipstick NEGATIVE NEGATIVE   Bilirubin Urine NEGATIVE NEGATIVE   Ketones, ur NEGATIVE NEGATIVE mg/dL   Protein, ur NEGATIVE NEGATIVE mg/dL   Nitrite NEGATIVE NEGATIVE   Leukocytes, UA TRACE (A) NEGATIVE  Pregnancy, urine     Status: None   Collection Time: 01/22/16  9:55 PM  Result Value Ref Range   Preg Test, Ur  NEGATIVE NEGATIVE  Urine microscopic-add on     Status: Abnormal   Collection Time: 01/22/16  9:55 PM  Result Value Ref Range   Squamous Epithelial / LPF 0-5 (A) NONE SEEN   WBC, UA 0-5 0 - 5 WBC/hpf   RBC / HPF 0-5 0 - 5 RBC/hpf   Bacteria, UA RARE (A) NONE SEEN   Imaging Studies: No results found.  ED COURSE  Nursing notes and initial vitals signs, including pulse oximetry, reviewed.  Vitals:   01/22/16 2159 01/23/16 0027  BP: 107/84 101/66  Pulse: 96 95  Resp: 18 18  Temp: 98.1 F (36.7 C)   TempSrc: Oral   SpO2: 100% 100%  Weight: 101 lb (45.8 kg)   Height: 5' (1.524 m)    1:03 AM The patient has been drinking fluids without emesis. She has ambulated to the bathroom.  PROCEDURES    ED DIAGNOSES     ICD-9-CM ICD-10-CM   1. Viral gastroenteritis 008.8 A08.4      I personally performed the services described in this documentation, which was scribed in my presence. The recorded information has been reviewed and is accurate.    Paula Libra, MD 01/23/16 [phone]

## 2016-01-23 MED ORDER — ONDANSETRON 8 MG PO TBDP: 8.0000 mg | ORAL_TABLET | Freq: Three times a day (TID) | ORAL | 0 refills | Status: DC | PRN

## 2016-01-23 MED ORDER — LORAZEPAM 2 MG/ML IJ SOLN
1.0000 mg | Freq: Once | INTRAMUSCULAR | Status: AC
  Administered 2016-01-23: 1 mg via INTRAVENOUS
  Filled 2016-01-23: qty 1

## 2016-01-23 MED ORDER — FAMOTIDINE IN NACL 20-0.9 MG/50ML-% IV SOLN
20.0000 mg | Freq: Once | INTRAVENOUS | Status: AC
  Administered 2016-01-23: 20 mg via INTRAVENOUS
  Filled 2016-01-23: qty 50

## 2016-09-21 ENCOUNTER — Encounter: Payer: Self-pay | Admitting: Gastroenterology

## 2016-11-03 ENCOUNTER — Encounter: Payer: Self-pay | Admitting: *Deleted

## 2016-11-10 ENCOUNTER — Encounter (INDEPENDENT_AMBULATORY_CARE_PROVIDER_SITE_OTHER): Payer: Self-pay

## 2016-11-10 ENCOUNTER — Encounter: Payer: Self-pay | Admitting: Gastroenterology

## 2016-11-10 ENCOUNTER — Ambulatory Visit (INDEPENDENT_AMBULATORY_CARE_PROVIDER_SITE_OTHER): Payer: 59 | Admitting: Gastroenterology

## 2016-11-10 VITALS — BP 90/60 | HR 98 | Ht 60.0 in | Wt 105.0 lb

## 2016-11-10 DIAGNOSIS — K625 Hemorrhage of anus and rectum: Secondary | ICD-10-CM

## 2016-11-10 DIAGNOSIS — K602 Anal fissure, unspecified: Secondary | ICD-10-CM

## 2016-11-10 DIAGNOSIS — K6289 Other specified diseases of anus and rectum: Secondary | ICD-10-CM | POA: Diagnosis not present

## 2016-11-10 MED ORDER — SUPREP BOWEL PREP KIT 17.5-3.13-1.6 GM/177ML PO SOLN: ORAL | 0 refills | Status: DC

## 2016-11-10 MED ORDER — AMBULATORY NON FORMULARY MEDICATION: 0 refills | Status: DC

## 2016-11-10 MED ORDER — AMBULATORY NON FORMULARY MEDICATION: Status: DC

## 2016-11-10 NOTE — Progress Notes (Signed)
Jennfier Abdulla    [id number]    May 09, 1985  Primary Care Physician:Shea Evans, MD  Referring Physician: No referring provider defined for this encounter.  Chief complaint:  Anal fissure, blood per rectum, hemorrhoids  HPI: 31 year old female here with complaints of intermittent blood per rectum associated with anorectal discomfort since May 2018. Patient was constipated at the time and she was straining excessively. Constipation has since resolved but she continues to have anal discomfort. She notices blood per rectum once or twice a week. She was prescribed Analapram, it has helped with her symptoms to some extent but did not completely resolve. Denies any nausea, vomiting, abdominal pain, melena or diarrhea . No family history of IBD or colon cancer. Other medical history includes status post C-section  Outpatient Encounter Prescriptions as of 11/10/2016  Medication Sig  . levonorgestrel-ethinyl estradiol (AVIANE,ALESSE,LESSINA) 0.1-20 MG-MCG tablet Take 1 tablet by mouth daily.  . Wheat Dextrin (BENEFIBER PO) Take 1 scoop by mouth daily.  . [DISCONTINUED] ondansetron (ZOFRAN ODT) 8 MG disintegrating tablet Take 1 tablet (8 mg total) by mouth every 8 (eight) hours as needed for nausea or vomiting.   No facility-administered encounter medications on file as of 11/10/2016.     Allergies as of 11/10/2016  . (No Known Allergies)    Past Medical History:  Diagnosis Date  . Anal fissure     Past Surgical History:  Procedure Laterality Date  . CESAREAN SECTION N/A 01/17/2014   Procedure: Primary CESAREAN SECTION;  Surgeon: Robley Fries, MD;  Location: [facility];  Service: Obstetrics;  Laterality: N/A;  EDD: 02/02/14  . NO PAST SURGERIES      History reviewed. No pertinent family history.  Social History   Social History  . Marital status: Married    Spouse name: N/A  . Number of children: N/A  . Years of education: N/A   Occupational History  . Not on  file.   Social History Main Topics  . Smoking status: Never Smoker  . Smokeless tobacco: Never Used  . Alcohol use No  . Drug use: No  . Sexual activity: Not on file   Other Topics Concern  . Not on file   Social History Narrative  . No narrative on file      Review of systems: Review of Systems  Constitutional: Negative for fever and chills.  HENT: Negative.   Eyes: Negative for blurred vision.  Respiratory: Negative for cough, shortness of breath and wheezing.   Cardiovascular: Negative for chest pain and palpitations.  Gastrointestinal: as per HPI Genitourinary: Negative for dysuria, urgency, frequency and hematuria.  Musculoskeletal: Negative for myalgias, back pain and joint pain.  Skin: Negative for itching and rash.  Neurological: Negative for dizziness, tremors, focal weakness, seizures and loss of consciousness.  Endo/Heme/Allergies: Negative for seasonal allergies.  Psychiatric/Behavioral: Negative for depression, suicidal ideas and hallucinations.  All other systems reviewed and are negative.   Physical Exam: Vitals:   11/10/16 0841  BP: 90/60  Pulse: 98   Body mass index is 20.51 kg/m. Gen:      No acute distress HEENT:  EOMI, sclera anicteric Neck:     No masses; no thyromegaly Lungs:    Clear to auscultation bilaterally; normal respiratory effort CV:         Regular rate and rhythm; no murmurs Abd:      + bowel sounds; soft, non-tender; no palpable masses, no distension Ext:    No  edema; adequate peripheral perfusion Skin:      Warm and dry; no rash Neuro: alert and oriented x 3 Psych: normal mood and affect Rectal exam: Increased anal sphincter tone, tender, + posterior anal fissure and no external hemorrhoids   Data Reviewed:  Reviewed labs, radiology imaging, old records and pertinent past GI work up   Assessment and Plan/Recommendations:  31 year old female here with complaints of anal discomfort and intermittent blood per rectum.  Patient has posterior anal fissure. Anoscopy not performed due to the anal discomfort/pain Will schedule for colonoscopy to evaluate and exclude possible associated IBD The risks and benefits as well as alternatives of endoscopic procedure(s) have been discussed and reviewed. All questions answered. The patient agrees to proceed.  Benefiber 1 tablespoon 3 times daily with meals Nitroglycerin 0.125% per rectum small pea-sized amount 3 times daily for next 6-8 weeks Okay to use Analapram as needed   K. Scherry RanVeena Nandigam , MD [phone] Mon-Fri 8a-5p [phone] after 5p, weekends, holidays  CC: No ref. provider found

## 2016-11-10 NOTE — Patient Instructions (Signed)
You have been scheduled for a colonoscopy. Please follow written instructions given to you at your visit today.  Please pick up your prep supplies at the pharmacy within the next 1-3 days. If you use inhalers (even only as needed), please bring them with you on the day of your procedure. Your physician has requested that you go to www.startemmi.com and enter the access code given to you at your visit today. This web site gives a general overview about your procedure. However, you should still follow specific instructions given to you by our office regarding your preparation for the procedure.   We have sent a prescription for nitroglycerin 0.125% gel to [facility]. You should apply a pea size amount to your rectum 2 to 3 times daily x 6-8 weeks.  [facility]'s information is below: Address: [address], [place], [place] [address]  [phone]  Please purchase the following medications over the counter and take as directed: Benefiber: 1 tablespoon three times a day with meals.  If you are age 29 or older, your body mass index should be between 23-30. Your Body mass index is 20.51 kg/m. If this is out of the aforementioned range listed, please consider follow up with your Primary Care Provider.  If you are age 29 or younger, your body mass index should be between 19-25. Your Body mass index is 20.51 kg/m. If this is out of the aformentioned range listed, please consider follow up with your Primary Care Provider.

## 2016-12-22 ENCOUNTER — Encounter: Payer: Self-pay | Admitting: Gastroenterology

## 2016-12-29 ENCOUNTER — Encounter: Payer: Self-pay | Admitting: Gastroenterology

## 2016-12-29 ENCOUNTER — Ambulatory Visit (AMBULATORY_SURGERY_CENTER): Payer: 59 | Admitting: Gastroenterology

## 2016-12-29 VITALS — BP 101/73 | HR 89 | Temp 98.6°F | Resp 18 | Ht 60.0 in | Wt 105.0 lb

## 2016-12-29 DIAGNOSIS — K922 Gastrointestinal hemorrhage, unspecified: Secondary | ICD-10-CM | POA: Diagnosis not present

## 2016-12-29 DIAGNOSIS — K602 Anal fissure, unspecified: Secondary | ICD-10-CM

## 2016-12-29 MED ORDER — SODIUM CHLORIDE 0.9 % IV SOLN: 500.0000 mL | INTRAVENOUS | Status: DC

## 2016-12-29 NOTE — Op Note (Signed)
[facility] Patient Name: Taylor Rangel Procedure Date: 12/29/2016 7:56 AM MRN: [id number] Endoscopist: Napoleon Form , MD Age: 31 Referring MD:  Date of Birth: 11/02/1985 Gender: Female Account #: [id number] Procedure:                Colonoscopy Indications:              Evaluation of unexplained GI bleeding Medicines:                Monitored Anesthesia Care Procedure:                Pre-Anesthesia Assessment:                           - Prior to the procedure, a History and Physical                            was performed, and patient medications and                            allergies were reviewed. The patient's tolerance of                            previous anesthesia was also reviewed. The risks                            and benefits of the procedure and the sedation                            options and risks were discussed with the patient.                            All questions were answered, and informed consent                            was obtained. Prior Anticoagulants: The patient has                            taken no previous anticoagulant or antiplatelet                            agents. ASA Grade Assessment: I - A normal, healthy                            patient. After reviewing the risks and benefits,                            the patient was deemed in satisfactory condition to                            undergo the procedure.                           After obtaining informed consent, the colonoscope  was passed under direct vision. Throughout the                            procedure, the patient's blood pressure, pulse, and                            oxygen saturations were monitored continuously. The                            Model PCF-[id number]) scope was introduced                            through the anus and advanced to the the terminal                            ileum, with identification  of the appendiceal                            orifice and IC valve. The colonoscopy was performed                            without difficulty. The patient tolerated the                            procedure well. The quality of the bowel                            preparation was excellent. The terminal ileum,                            ileocecal valve, appendiceal orifice, and rectum                            were photographed. Scope In: 8:14:59 AM Scope Out: 8:30:45 AM Scope Withdrawal Time: 0 hours 8 minutes 57 seconds  Total Procedure Duration: 0 hours 15 minutes 46 seconds  Findings:                 The perianal and digital rectal examinations were                            normal.                           Non-bleeding internal hemorrhoids were found during                            retroflexion. The hemorrhoids were small.                           The exam was otherwise without abnormality. Complications:            No immediate complications. Estimated Blood Loss:     Estimated blood loss: none. Impression:               - Non-bleeding internal hemorrhoids.                           -  The examination was otherwise normal.                           - No specimens collected. Recommendation:           - Patient has a contact number available for                            emergencies. The signs and symptoms of potential                            delayed complications were discussed with the                            patient. Return to normal activities tomorrow.                            Written discharge instructions were provided to the                            patient.                           - Resume previous diet.                           - Continue present medications.                           - Repeat colonoscopy at age 53 for screening                            purposes.                           - Return to GI clinic PRN. Napoleon Form, MD 12/29/2016  8:37:35 AM This report has been signed electronically.

## 2016-12-29 NOTE — Progress Notes (Signed)
A/ox3 pleased with MAC, report to Mickle Mallory

## 2016-12-29 NOTE — Patient Instructions (Signed)
YOU HAD AN ENDOSCOPIC PROCEDURE TODAY AT THE [facility]:   Refer to the procedure report that was given to you for any specific questions about what was found during the examination.  If the procedure report does not answer your questions, please call your gastroenterologist to clarify.  If you requested that your care partner not be given the details of your procedure findings, then the procedure report has been included in a sealed envelope for you to review at your convenience later.  YOU SHOULD EXPECT: Some feelings of bloating in the abdomen. Passage of more gas than usual.  Walking can help get rid of the air that was put into your GI tract during the procedure and reduce the bloating. If you had a lower endoscopy (such as a colonoscopy or flexible sigmoidoscopy) you may notice spotting of blood in your stool or on the toilet paper. If you underwent a bowel prep for your procedure, you may not have a normal bowel movement for a few days.  Please Note:  You might notice some irritation and congestion in your nose or some drainage.  This is from the oxygen used during your procedure.  There is no need for concern and it should clear up in a day or so.  SYMPTOMS TO REPORT IMMEDIATELY:   Following lower endoscopy (colonoscopy or flexible sigmoidoscopy):  Excessive amounts of blood in the stool  Significant tenderness or worsening of abdominal pains  Swelling of the abdomen that is new, acute  Fever of 100F or higher   For urgent or emergent issues, a gastroenterologist can be reached at any hour by calling [phone].   DIET:  We do recommend a small meal at first, but then you may proceed to your regular diet.  Drink plenty of fluids but you should avoid alcoholic beverages for 24 hours.  ACTIVITY:  You should plan to take it easy for the rest of today and you should NOT DRIVE or use heavy machinery until tomorrow (because of the sedation medicines used during the test).     FOLLOW UP: Our staff will call the number listed on your records the next business day following your procedure to check on you and address any questions or concerns that you may have regarding the information given to you following your procedure. If we do not reach you, we will leave a message.  However, if you are feeling well and you are not experiencing any problems, there is no need to return our call.  We will assume that you have returned to your regular daily activities without incident.    SIGNATURES/CONFIDENTIALITY: You and/or your care partner have signed paperwork which will be entered into your electronic medical record.  These signatures attest to the fact that that the information above on your After Visit Summary has been reviewed and is understood.  Full responsibility of the confidentiality of this discharge information lies with you and/or your care-partner. 

## 2016-12-30 ENCOUNTER — Telehealth: Payer: Self-pay | Admitting: *Deleted

## 2016-12-30 NOTE — Telephone Encounter (Signed)
  Follow up Call-  Call back number 12/29/2016  Post procedure Call Back phone  # [phone]  Permission to leave phone message Yes  Some recent data might be hidden     Patient questions:  Do you have a fever, pain , or abdominal swelling? No. Pain Score  3 *  Have you tolerated food without any problems? Yes.    Have you been able to return to your normal activities? Yes.    Do you have any questions about your discharge instructions: Diet   No. Medications  Yes.   Follow up visit  No.  Do you have questions or concerns about your Care? No.  Actions: * If pain score is 4 or above: No action needed, pain <4.  Pt states she has pain left side.  She describes it as feeling like gas.  States she has been passing gas.  Denies fever or abdominal distention. Advised to call back if pain does not resolve or becomes worse.  Also told her she can use gas-x or  Other OTC meds for gas.  Wants to know if she should continue her cream for anal fissure.  She states it has healed. I advised her to d/c but to keep on hand if she needs in the future.

## 2017-03-16 ENCOUNTER — Other Ambulatory Visit: Payer: Self-pay

## 2017-03-16 ENCOUNTER — Emergency Department (HOSPITAL_BASED_OUTPATIENT_CLINIC_OR_DEPARTMENT_OTHER): Payer: 59

## 2017-03-16 ENCOUNTER — Emergency Department (HOSPITAL_BASED_OUTPATIENT_CLINIC_OR_DEPARTMENT_OTHER)
Admission: EM | Admit: 2017-03-16 | Discharge: 2017-03-16 | Disposition: A | Discharge status: Home / Self Care | Payer: 59 | Attending: Emergency Medicine | Admitting: Emergency Medicine

## 2017-03-16 ENCOUNTER — Encounter (HOSPITAL_BASED_OUTPATIENT_CLINIC_OR_DEPARTMENT_OTHER): Payer: Self-pay

## 2017-03-16 DIAGNOSIS — R1030 Lower abdominal pain, unspecified: Secondary | ICD-10-CM | POA: Diagnosis not present

## 2017-03-16 DIAGNOSIS — Z3A01 Less than 8 weeks gestation of pregnancy: Secondary | ICD-10-CM | POA: Diagnosis not present

## 2017-03-16 DIAGNOSIS — O26891 Other specified pregnancy related conditions, first trimester: Secondary | ICD-10-CM | POA: Diagnosis not present

## 2017-03-16 DIAGNOSIS — O209 Hemorrhage in early pregnancy, unspecified: Secondary | ICD-10-CM | POA: Insufficient documentation

## 2017-03-16 DIAGNOSIS — O4690 Antepartum hemorrhage, unspecified, unspecified trimester: Secondary | ICD-10-CM

## 2017-03-16 LAB — CBC WITH DIFFERENTIAL/PLATELET
BASOS ABS: 0 10*3/uL (ref 0.0–0.1)
Basophils Relative: 0 %
Eosinophils Absolute: 0.1 10*3/uL (ref 0.0–0.7)
Eosinophils Relative: 2 %
HCT: 33.3 % — ABNORMAL LOW (ref 36.0–46.0)
Hemoglobin: 10.9 g/dL — ABNORMAL LOW (ref 12.0–15.0)
LYMPHS PCT: 27 %
Lymphs Abs: 2.1 10*3/uL (ref 0.7–4.0)
MCH: 27.3 pg (ref 26.0–34.0)
MCHC: 32.7 g/dL (ref 30.0–36.0)
MCV: 83.3 fL (ref 78.0–100.0)
Monocytes Absolute: 0.7 10*3/uL (ref 0.1–1.0)
Monocytes Relative: 9 %
NEUTROS PCT: 62 %
Neutro Abs: 5 10*3/uL (ref 1.7–7.7)
Platelets: 256 10*3/uL (ref 150–400)
RBC: 4 MIL/uL (ref 3.87–5.11)
RDW: 13.8 % (ref 11.5–15.5)
WBC: 7.9 10*3/uL (ref 4.0–10.5)

## 2017-03-16 LAB — WET PREP, GENITAL
Sperm: NONE SEEN
TRICH WET PREP: NONE SEEN
Yeast Wet Prep HPF POC: NONE SEEN

## 2017-03-16 LAB — HCG, QUANTITATIVE, PREGNANCY: HCG, BETA CHAIN, QUANT, S: 137146 m[IU]/mL — AB (ref <5)

## 2017-03-16 NOTE — ED Notes (Signed)
Patient transported to Ultrasound 

## 2017-03-16 NOTE — ED Triage Notes (Signed)
Pt c/o vag bleeding x 1 hour-1st pad in place-pt is 7 weeks preg by blood test-US next week-vaginal d/c x 4 days-was seen at rx vaginal cream for infection-NAD-steady gait

## 2017-03-16 NOTE — ED Provider Notes (Signed)
[facility] EMERGENCY DEPARTMENT Provider Note   CSN: [id number] Arrival date & time: 12/20/[id number]  2144     History   Chief Complaint Chief Complaint  Patient presents with  . Vaginal Bleeding    [redacted] weeks pregnant    HPI Taylor Rangel is a 31 y.o. female.  Patient is a 31 year old female with recent positive pregnancy test presenting with vaginal bleeding that started this evening.  She endorses some lower abdominal cramping as well.  She has no fevers or chills.  Her last pregnancy was uncomplicated.  She is Rh+.  She has no personal or family history of clotting disorders.  She has had no trauma.  Based on her last menstrual period she would be about 7 weeks.  She and her husband have not had sex in recent days.  She has no history of sexual transmitted diseases.     Past Medical History:  Diagnosis Date  . Anal fissure     Patient Active Problem List   Diagnosis Date Noted  . Breech presentation of fetus 01/17/2014  . Oligohydramnios in third trimester, antepartum 01/17/2014  . Cesarean delivery delivered 01/17/2014  . Postpartum care following cesarean delivery (10/23) 01/17/2014    Past Surgical History:  Procedure Laterality Date  . CESAREAN SECTION N/A 01/17/2014   Procedure: Primary CESAREAN SECTION;  Surgeon: Robley FriesVaishali R Mody, MD;  Location: [facility];  Service: Obstetrics;  Laterality: N/A;  EDD: 02/02/14    OB History    Gravida Para Term Preterm AB Living   2 1 1     1    SAB TAB Ectopic Multiple Live Births           1       Home Medications    Prior to Admission medications   Medication Sig Start Date End Date Taking? Authorizing Provider  UNKNOWN TO PATIENT Vaginal cream for infection   Yes [provider]    Family History Family History  Problem Relation Age of Onset  . Colon cancer Neg Hx     Social History Social History   Tobacco Use  . Smoking status: Never Smoker  . Smokeless tobacco: Never Used  Substance Use  Topics  . Alcohol use: No  . Drug use: No     Allergies   Patient has no known allergies.   Review of Systems Review of Systems  Constitutional: Negative.   Respiratory: Negative.   Cardiovascular: Negative.   Gastrointestinal: Negative for abdominal pain.  Endocrine: Negative.   Genitourinary: Positive for pelvic pain, vaginal bleeding and vaginal discharge. Negative for decreased urine volume, dysuria, flank pain, frequency, genital sores, urgency and vaginal pain.  Musculoskeletal: Negative.   Skin: Negative for rash.     Physical Exam Updated Vital Signs BP 134/82 (BP Location: Left Arm)   Pulse (!) 108   Temp 98.1 F (36.7 C) (Oral)   Resp 18   LMP 11/26/2016   SpO2 100%   Physical Exam  Constitutional: She appears well-developed and well-nourished. No distress.  HENT:  Head: Normocephalic and atraumatic.  Eyes: Conjunctivae are normal.  Neck: Neck supple.  Cardiovascular: Normal rate and regular rhythm.  No murmur heard. Pulmonary/Chest: Effort normal and breath sounds normal. No respiratory distress.  Abdominal: Soft. There is no tenderness.  Genitourinary:  Genitourinary Comments: Normal-appearing external female genitalia without lesions.  Speculum exam revealed no blood, but moderate amount of white discharge in vaginal vault.  Cervix closed and normal-appearing and on pelvic exam cervix was  closed with no adnexal masses or tenderness.   Musculoskeletal: She exhibits no edema.  Neurological: She is alert.  Skin: Skin is warm and dry.  Psychiatric: She has a normal mood and affect.  Nursing note and vitals reviewed.    ED Treatments / Results  Labs (all labs ordered are listed, but only abnormal results are displayed) Labs Reviewed  CBC WITH DIFFERENTIAL/PLATELET - Abnormal; Notable for the following components:      Result Value   Hemoglobin 10.9 (*)    HCT 33.3 (*)    All other components within normal limits  HCG, QUANTITATIVE, PREGNANCY     EKG  EKG Interpretation None       Radiology No results found.  Procedures Procedures (including critical care time)  Medications Ordered in ED Medications - No data to display   Initial Impression / Assessment and Plan / ED Course  I have reviewed the triage vital signs and the nursing notes.  Pertinent labs & imaging results that were available during my care of the patient were reviewed by me and considered in my medical decision making (see chart for details).     Patient is a 31 year old female with positive pregnancy test with last menstrual period 7 weeks ago presenting with several hours of vaginal bleeding with some mild pelvic pain without fevers or chills.  She is Rh+ and had an uncomplicated pregnancy in 2015.  She had pelvic and transvaginal ultrasound confirming viable intrauterine pregnancy without abnormalities.  At the time of discharge patient's vital signs are stable she is appropriate for discharge.  Recommended [facility] for any further concerning signs or symptoms including, vaginal bleeding, worsening pelvic pain, fevers or chills.  She should follow-up with her OB/GYN next week at her initial prenatal visit.  At this time she appears to have a viable pregnancy.   Final Clinical Impressions(s) / ED Diagnoses   Final diagnoses:  Vaginal bleeding during pregnancy    ED Discharge Orders    None       Renne MuscaWarden, Korrine Sicard L, MD 03/16/17 [id number]    Rolland PorterJames, Mark, MD 03/16/17 [phone]

## 2017-03-16 NOTE — Discharge Instructions (Signed)
You were seen today for vaginal bleeding.  On exam I did not see any bleeding and your cervix was closed.  Ultrasound confirmed a pregnancy and showed no abnormalities.  If you have any further episodes of bleeding , worsening pelvic pain, fevers or chills you should go to [facility] to the maternal admissions unit.  Otherwise follow-up with your OB/GYN for your initial prenatal visit.

## 2017-03-28 NOTE — L&D Delivery Note (Signed)
Delivery Note At  a viable and healthy female was delivered over intact perineum via vbac  (Presentation: cephalic ; OA  ).  APGAR:8 ,9 ; weight   not yet done Placenta status: delivered, spontaneously, intact .  Cord: 3vv,  with the following complications: none.  Anesthesia:  epidural Episiotomy:  none Lacerations:  first degree, bilateral labial, small anterior cervical laceration at about 12 o'clock; all hemostatic after repair with 3-0 vicryl; edematous vaginal mucousa, packing placed after hemostasis of lacerations confirmed Suture Repair: 3.0 vicryl Est. Blood Loss (mL):  600ml (small amount atony lus, massage performed and methergine 1 dose given; at the small cervical laceration there was an active bleeding vessel that contributed significantly to the patient's ebl; good hemostatis, though, after repair  Mom to postpartum.  Baby to Couplet care / Skin to Skin.  Vick FreesSusan E Ezri Fanguy 10/22/2017, 3:47 PM

## 2017-04-03 LAB — OB RESULTS CONSOLE RUBELLA ANTIBODY, IGM: Rubella: IMMUNE

## 2017-04-03 LAB — OB RESULTS CONSOLE HIV ANTIBODY (ROUTINE TESTING): HIV: NONREACTIVE

## 2017-04-03 LAB — OB RESULTS CONSOLE HEPATITIS B SURFACE ANTIGEN: Hepatitis B Surface Ag: NEGATIVE

## 2017-04-03 LAB — OB RESULTS CONSOLE RPR: RPR: NONREACTIVE

## 2017-04-03 LAB — OB RESULTS CONSOLE GC/CHLAMYDIA
CHLAMYDIA, DNA PROBE: NEGATIVE
GC PROBE AMP, GENITAL: NEGATIVE

## 2017-10-02 LAB — OB RESULTS CONSOLE GBS: GBS: NEGATIVE

## 2017-10-22 ENCOUNTER — Inpatient Hospital Stay (HOSPITAL_COMMUNITY): Payer: 59 | Admitting: Anesthesiology

## 2017-10-22 ENCOUNTER — Inpatient Hospital Stay (HOSPITAL_COMMUNITY)
Admission: AD | Admit: 2017-10-22 | Discharge: 2017-10-24 | DRG: 768 | Disposition: A | Discharge status: Home / Self Care | Payer: 59 | Attending: Obstetrics and Gynecology | Admitting: Obstetrics and Gynecology

## 2017-10-22 ENCOUNTER — Encounter (HOSPITAL_COMMUNITY): Payer: Self-pay | Admitting: *Deleted

## 2017-10-22 DIAGNOSIS — O34219 Maternal care for unspecified type scar from previous cesarean delivery: Secondary | ICD-10-CM | POA: Diagnosis present

## 2017-10-22 DIAGNOSIS — O411230 Chorioamnionitis, third trimester, not applicable or unspecified: Secondary | ICD-10-CM | POA: Diagnosis present

## 2017-10-22 DIAGNOSIS — D62 Acute posthemorrhagic anemia: Secondary | ICD-10-CM | POA: Diagnosis not present

## 2017-10-22 DIAGNOSIS — Z3A38 38 weeks gestation of pregnancy: Secondary | ICD-10-CM

## 2017-10-22 DIAGNOSIS — O9081 Anemia of the puerperium: Secondary | ICD-10-CM | POA: Diagnosis not present

## 2017-10-22 LAB — CBC
HCT: 37.9 % (ref 36.0–46.0)
HEMATOCRIT: 32.8 % — AB (ref 36.0–46.0)
HEMOGLOBIN: 11.1 g/dL — AB (ref 12.0–15.0)
Hemoglobin: 13.1 g/dL (ref 12.0–15.0)
MCH: 30.9 pg (ref 26.0–34.0)
MCH: 30.7 pg (ref 26.0–34.0)
MCHC: 34.6 g/dL (ref 30.0–36.0)
MCHC: 33.8 g/dL (ref 30.0–36.0)
MCV: 89.4 fL (ref 78.0–100.0)
MCV: 90.9 fL (ref 78.0–100.0)
PLATELETS: 220 10*3/uL (ref 150–400)
Platelets: 178 10*3/uL (ref 150–400)
RBC: 4.24 MIL/uL (ref 3.87–5.11)
RBC: 3.61 MIL/uL — ABNORMAL LOW (ref 3.87–5.11)
RDW: 13.8 % (ref 11.5–15.5)
RDW: 13.8 % (ref 11.5–15.5)
WBC: 10.2 10*3/uL (ref 4.0–10.5)
WBC: 16.1 10*3/uL — ABNORMAL HIGH (ref 4.0–10.5)

## 2017-10-22 LAB — POCT FERN TEST: POCT Fern Test: POSITIVE

## 2017-10-22 LAB — TYPE AND SCREEN
ABO/RH(D): B POS
Antibody Screen: NEGATIVE

## 2017-10-22 LAB — RPR: RPR: NONREACTIVE

## 2017-10-22 MED ORDER — METHYLERGONOVINE MALEATE 0.2 MG/ML IJ SOLN
0.2000 mg | Freq: Once | INTRAMUSCULAR | Status: AC
  Administered 2017-10-22: 0.2 mg via INTRAMUSCULAR

## 2017-10-22 MED ORDER — OXYCODONE-ACETAMINOPHEN 5-325 MG PO TABS: 2.0000 | ORAL_TABLET | ORAL | Status: DC | PRN

## 2017-10-22 MED ORDER — ONDANSETRON HCL 4 MG/2ML IJ SOLN: 4.0000 mg | Freq: Four times a day (QID) | INTRAMUSCULAR | Status: DC | PRN

## 2017-10-22 MED ORDER — LACTATED RINGERS IV SOLN: 500.0000 mL | Freq: Once | INTRAVENOUS | Status: DC

## 2017-10-22 MED ORDER — COCONUT OIL OIL
1.0000 "application " | TOPICAL_OIL | Status: DC | PRN
  Administered 2017-10-24: 1 via TOPICAL
  Filled 2017-10-22: qty 120

## 2017-10-22 MED ORDER — SIMETHICONE 80 MG PO CHEW
80.0000 mg | CHEWABLE_TABLET | ORAL | Status: DC
  Administered 2017-10-22 – 2017-10-24 (×2): 80 mg via ORAL
  Filled 2017-10-22 (×2): qty 1

## 2017-10-22 MED ORDER — LACTATED RINGERS IV SOLN
500.0000 mL | Freq: Once | INTRAVENOUS | Status: AC
  Administered 2017-10-22: 500 mL via INTRAVENOUS

## 2017-10-22 MED ORDER — SOD CITRATE-CITRIC ACID 500-334 MG/5ML PO SOLN: 30.0000 mL | ORAL | Status: DC | PRN

## 2017-10-22 MED ORDER — BUTORPHANOL TARTRATE 1 MG/ML IJ SOLN
1.0000 mg | Freq: Once | INTRAMUSCULAR | Status: AC
  Administered 2017-10-22: 1 mg via INTRAVENOUS
  Filled 2017-10-22: qty 1

## 2017-10-22 MED ORDER — DIPHENHYDRAMINE HCL 50 MG/ML IJ SOLN: 12.5000 mg | INTRAMUSCULAR | Status: DC | PRN

## 2017-10-22 MED ORDER — PHENYLEPHRINE 40 MCG/ML (10ML) SYRINGE FOR IV PUSH (FOR BLOOD PRESSURE SUPPORT): 80.0000 ug | PREFILLED_SYRINGE | INTRAVENOUS | Status: DC | PRN

## 2017-10-22 MED ORDER — FLEET ENEMA 7-19 GM/118ML RE ENEM: 1.0000 | ENEMA | RECTAL | Status: DC | PRN

## 2017-10-22 MED ORDER — IBUPROFEN 600 MG PO TABS
600.0000 mg | ORAL_TABLET | Freq: Four times a day (QID) | ORAL | Status: DC
  Administered 2017-10-22 – 2017-10-24 (×7): 600 mg via ORAL
  Filled 2017-10-22 (×7): qty 1

## 2017-10-22 MED ORDER — LIDOCAINE HCL (PF) 1 % IJ SOLN
INTRAMUSCULAR | Status: DC | PRN
  Administered 2017-10-22: 2 mL via EPIDURAL
  Administered 2017-10-22: 3 mL via EPIDURAL
  Administered 2017-10-22: 5 mL via EPIDURAL

## 2017-10-22 MED ORDER — LACTATED RINGERS IV SOLN
INTRAVENOUS | Status: DC
  Administered 2017-10-22 (×4): via INTRAVENOUS

## 2017-10-22 MED ORDER — OXYCODONE-ACETAMINOPHEN 5-325 MG PO TABS: 1.0000 | ORAL_TABLET | ORAL | Status: DC | PRN

## 2017-10-22 MED ORDER — PRENATAL MULTIVITAMIN CH
1.0000 | ORAL_TABLET | Freq: Every day | ORAL | Status: DC
  Administered 2017-10-23 – 2017-10-24 (×2): 1 via ORAL
  Filled 2017-10-22 (×2): qty 1

## 2017-10-22 MED ORDER — LIDOCAINE HCL (PF) 1 % IJ SOLN
INTRAMUSCULAR | Status: AC
  Filled 2017-10-22: qty 30

## 2017-10-22 MED ORDER — TERBUTALINE SULFATE 1 MG/ML IJ SOLN
0.2500 mg | Freq: Once | INTRAMUSCULAR | Status: DC | PRN
  Filled 2017-10-22: qty 1

## 2017-10-22 MED ORDER — DIPHENHYDRAMINE HCL 25 MG PO CAPS: 25.0000 mg | ORAL_CAPSULE | Freq: Four times a day (QID) | ORAL | Status: DC | PRN

## 2017-10-22 MED ORDER — TERBUTALINE SULFATE 1 MG/ML IJ SOLN
0.2500 mg | Freq: Once | INTRAMUSCULAR | Status: AC
  Administered 2017-10-22: 0.25 mg via SUBCUTANEOUS
  Filled 2017-10-22: qty 1

## 2017-10-22 MED ORDER — FENTANYL 2.5 MCG/ML BUPIVACAINE 1/10 % EPIDURAL INFUSION (WH - ANES)
14.0000 mL/h | INTRAMUSCULAR | Status: DC | PRN
  Administered 2017-10-22: 14 mL/h via EPIDURAL
  Administered 2017-10-22: 12 mL/h via EPIDURAL
  Filled 2017-10-22 (×2): qty 100

## 2017-10-22 MED ORDER — WITCH HAZEL-GLYCERIN EX PADS: 1.0000 "application " | MEDICATED_PAD | CUTANEOUS | Status: DC | PRN

## 2017-10-22 MED ORDER — SODIUM CHLORIDE 0.9 % IV SOLN
2.0000 g | Freq: Four times a day (QID) | INTRAVENOUS | Status: AC
  Administered 2017-10-22 – 2017-10-23 (×4): 2 g via INTRAVENOUS
  Filled 2017-10-22 (×4): qty 2

## 2017-10-22 MED ORDER — FENTANYL 2.5 MCG/ML BUPIVACAINE 1/10 % EPIDURAL INFUSION (WH - ANES): 14.0000 mL/h | INTRAMUSCULAR | Status: DC | PRN

## 2017-10-22 MED ORDER — TETANUS-DIPHTH-ACELL PERTUSSIS 5-2.5-18.5 LF-MCG/0.5 IM SUSP: 0.5000 mL | Freq: Once | INTRAMUSCULAR | Status: DC

## 2017-10-22 MED ORDER — LACTATED RINGERS IV SOLN: 500.0000 mL | INTRAVENOUS | Status: DC | PRN

## 2017-10-22 MED ORDER — GENTAMICIN SULFATE 40 MG/ML IJ SOLN
5.0000 mg/kg | INTRAVENOUS | Status: DC
  Administered 2017-10-22: 290 mg via INTRAVENOUS
  Filled 2017-10-22: qty 7.25

## 2017-10-22 MED ORDER — SODIUM CHLORIDE 0.9 % IV SOLN
2.0000 g | Freq: Four times a day (QID) | INTRAVENOUS | Status: DC
  Administered 2017-10-22: 2 g via INTRAVENOUS
  Filled 2017-10-22 (×3): qty 2000
  Filled 2017-10-22: qty 2
  Filled 2017-10-22: qty 2000

## 2017-10-22 MED ORDER — AMPICILLIN SODIUM 2 G IJ SOLR
2.0000 g | Freq: Four times a day (QID) | INTRAMUSCULAR | Status: DC
  Filled 2017-10-22 (×2): qty 2000

## 2017-10-22 MED ORDER — OXYTOCIN 40 UNITS IN LACTATED RINGERS INFUSION - SIMPLE MED
1.0000 m[IU]/min | INTRAVENOUS | Status: DC
  Administered 2017-10-22: 2 m[IU]/min via INTRAVENOUS

## 2017-10-22 MED ORDER — SIMETHICONE 80 MG PO CHEW: 80.0000 mg | CHEWABLE_TABLET | ORAL | Status: DC | PRN

## 2017-10-22 MED ORDER — EPHEDRINE 5 MG/ML INJ
10.0000 mg | INTRAVENOUS | Status: DC | PRN
  Administered 2017-10-22: 10 mg via INTRAVENOUS
  Filled 2017-10-22: qty 2

## 2017-10-22 MED ORDER — OXYTOCIN 40 UNITS IN LACTATED RINGERS INFUSION - SIMPLE MED
INTRAVENOUS | Status: AC
  Filled 2017-10-22: qty 1000

## 2017-10-22 MED ORDER — GENTAMICIN SULFATE 40 MG/ML IJ SOLN
1.5000 mg/kg | Freq: Once | INTRAVENOUS | Status: DC
  Filled 2017-10-22: qty 2.25

## 2017-10-22 MED ORDER — METHYLERGONOVINE MALEATE 0.2 MG/ML IJ SOLN
INTRAMUSCULAR | Status: AC
Start: 2017-10-22 — End: 2017-10-22
  Administered 2017-10-22: 0.2 mg via INTRAMUSCULAR
  Filled 2017-10-22: qty 1

## 2017-10-22 MED ORDER — DIBUCAINE 1 % RE OINT: 1.0000 "application " | TOPICAL_OINTMENT | RECTAL | Status: DC | PRN

## 2017-10-22 MED ORDER — OXYTOCIN 40 UNITS IN LACTATED RINGERS INFUSION - SIMPLE MED: 2.5000 [IU]/h | INTRAVENOUS | Status: DC

## 2017-10-22 MED ORDER — LIDOCAINE HCL (PF) 1 % IJ SOLN
30.0000 mL | INTRAMUSCULAR | Status: DC | PRN
  Filled 2017-10-22: qty 30

## 2017-10-22 MED ORDER — ACETAMINOPHEN 325 MG PO TABS
650.0000 mg | ORAL_TABLET | ORAL | Status: DC | PRN
  Administered 2017-10-23 (×2): 650 mg via ORAL
  Filled 2017-10-22 (×2): qty 2

## 2017-10-22 MED ORDER — OXYTOCIN 40 UNITS IN LACTATED RINGERS INFUSION - SIMPLE MED: 2.5000 [IU]/h | INTRAVENOUS | Status: AC

## 2017-10-22 MED ORDER — EPHEDRINE 5 MG/ML INJ: 10.0000 mg | INTRAVENOUS | Status: DC | PRN

## 2017-10-22 MED ORDER — OXYTOCIN BOLUS FROM INFUSION
500.0000 mL | Freq: Once | INTRAVENOUS | Status: AC
  Administered 2017-10-22: 500 mL via INTRAVENOUS

## 2017-10-22 MED ORDER — PHENYLEPHRINE 40 MCG/ML (10ML) SYRINGE FOR IV PUSH (FOR BLOOD PRESSURE SUPPORT)
80.0000 ug | PREFILLED_SYRINGE | INTRAVENOUS | Status: DC | PRN
  Administered 2017-10-22: 40 ug via INTRAVENOUS
  Filled 2017-10-22: qty 5
  Filled 2017-10-22: qty 10

## 2017-10-22 MED ORDER — ACETAMINOPHEN 325 MG PO TABS
650.0000 mg | ORAL_TABLET | ORAL | Status: DC | PRN
  Administered 2017-10-22: 650 mg via ORAL
  Filled 2017-10-22: qty 2

## 2017-10-22 MED ORDER — LACTATED RINGERS IV SOLN: INTRAVENOUS | Status: DC

## 2017-10-22 MED ORDER — MENTHOL 3 MG MT LOZG: 1.0000 | LOZENGE | OROMUCOSAL | Status: DC | PRN

## 2017-10-22 MED ORDER — SENNOSIDES-DOCUSATE SODIUM 8.6-50 MG PO TABS
2.0000 | ORAL_TABLET | ORAL | Status: DC
  Administered 2017-10-22 – 2017-10-24 (×2): 2 via ORAL
  Filled 2017-10-22 (×2): qty 2

## 2017-10-22 MED ORDER — PHENYLEPHRINE 40 MCG/ML (10ML) SYRINGE FOR IV PUSH (FOR BLOOD PRESSURE SUPPORT)
80.0000 ug | PREFILLED_SYRINGE | INTRAVENOUS | Status: DC | PRN
  Administered 2017-10-22: 40 ug via INTRAVENOUS
  Administered 2017-10-22: 20 ug via INTRAVENOUS
  Filled 2017-10-22: qty 10
  Filled 2017-10-22: qty 5

## 2017-10-22 MED ORDER — SIMETHICONE 80 MG PO CHEW
80.0000 mg | CHEWABLE_TABLET | Freq: Three times a day (TID) | ORAL | Status: DC
  Administered 2017-10-23 – 2017-10-24 (×3): 80 mg via ORAL
  Filled 2017-10-22 (×3): qty 1

## 2017-10-22 MED ORDER — EPHEDRINE 5 MG/ML INJ
10.0000 mg | INTRAVENOUS | Status: DC | PRN
  Filled 2017-10-22: qty 2
  Filled 2017-10-22: qty 4

## 2017-10-22 NOTE — Progress Notes (Signed)
Comfortable with epidural, but feeling a lot of pressure and wants to push  Patient Vitals for the past 24 hrs:  BP Temp Temp src Pulse Resp SpO2 Height Weight  10/22/17 1100 (!) 101/46 - - (!) 126 - - - -  10/22/17 1036 - (!) 102.3 F (39.1 C) Axillary - - - - -  10/22/17 1030 111/60 - - (!) 109 18 - - -  10/22/17 1015 114/69 - - (!) 112 18 - - -  10/22/17 1000 112/70 - - (!) 117 16 - - -  10/22/17 0945 112/65 - - (!) 108 16 - - -  10/22/17 0930 106/67 - - (!) 132 16 - - -  10/22/17 0916 102/63 - - (!) 107 - - - -  10/22/17 0900 95/66 100 F (37.8 C) Oral (!) 117 - - - -  10/22/17 0845 109/61 - - (!) 113 16 - - -  10/22/17 0830 (!) 101/57 - - (!) 113 16 - - -  10/22/17 0815 (!) 104/55 - - (!) 106 16 - - -  10/22/17 0800 (!) 103/45 - - (!) 110 16 - - -  10/22/17 0754 - 98.5 F (36.9 C) Oral - 20 - 5' (1.524 m) 126 lb (57.2 kg)  10/22/17 0735 97/60 - - (!) 111 16 - - -  10/22/17 0730 (!) 107/58 - - (!) 107 16 - - -  10/22/17 0725 (!) 92/46 - - (!) 123 20 99 % - -  10/22/17 0720 (!) 96/54 - - (!) 115 20 99 % - -  10/22/17 0715 (!) 93/50 - - (!) 114 20 100 % - -  10/22/17 0708 (!) 103/55 - - 95 20 - - -  10/22/17 0704 90/64 - - (!) 116 20 - - -  10/22/17 0701 (!) 70/53 - - (!) 112 18 - - -  10/22/17 0700 - - - - 20 - - -  10/22/17 0659 92/61 - - (!) 121 18 - - -  10/22/17 0650 (!) 110/44 - - (!) 110 20 100 % - -  10/22/17 0645 94/79 - - - 18 100 % - -  10/22/17 0640 (!) 107/35 - - 100 20 100 % - -  10/22/17 0635 (!) 90/56 - - (!) 165 20 100 % - -  10/22/17 0630 110/76 98 F (36.7 C) Oral - 18 100 % - -  10/22/17 0625 (!) 93/51 - - (!) 134 18 100 % - -  10/22/17 0620 108/74 - - (!) 162 18 - - -  10/22/17 0615 102/74 - - (!) 111 20 98 % - -  10/22/17 0610 (!) 130/56 - - (!) 123 18 96 % - -  10/22/17 0605 112/70 - - (!) 101 20 97 % - -  10/22/17 0600 108/83 - - (!) 112 20 - - -  10/22/17 0532 (!) 103/56 - - (!) 101 20 - - -  10/22/17 0137 106/69 98.3 F (36.8 C) Oral 91 18 - 5'  (1.524 m) 126 lb (57.2 kg)   A&ox3 nml respirations Abd: soft, nt, gravid Cx: c/c/0, cephalic, OP LE: no edema, nt bilat  Fht: baseline increased to 180s with early decles, variables noted, few late decels noted with longest about 1 1/2 min with return to baseline; +accels, baseline appears to be returning to 150s Toco: q 1-2 min  A/P: 38.6 wga 1. TOLAC - plan vaginal delivery, patient making progress; encourage position changes and peanut ball to help with rotation;  discourage patient from pushing at this time 2. Chorioamnionitis - begin ampicillin/gentamycin now; tylenol also given 3. Fetal status - overall reassuring and will monitor closely, tachy d/t chorio, no repetitive late decelerations 4. gbs negative 5. H/o ltcs for breech

## 2017-10-22 NOTE — Progress Notes (Signed)
RN called Dr. Desmond Lopeurk about patient's low B/P 3 doses of phenylephrine and 10mg  of ephedrine and 1litre of fluid.

## 2017-10-22 NOTE — MAU Provider Note (Signed)
S: Ms. Taylor Rangel is a 32 y.o. G2P1001 at 1768w6d  who presents to MAU today complaining of leaking of fluid since 11 pm. She denies vaginal bleeding. She endorses contractions. She reports normal fetal movement.    O: BP 106/69   Pulse 91   Temp 98.3 F (36.8 C) (Oral)   Resp 18   Ht 5' (1.524 m)   Wt 126 lb (57.2 kg)   LMP 11/26/2016   BMI 24.61 kg/m  GENERAL: Well-developed, well-nourished female in no acute distress.  HEAD: Normocephalic, atraumatic.  CHEST: Normal effort of breathing, regular heart rate ABDOMEN: Soft, nontender, gravid PELVIC: Normal external female genitalia. Vagina is pink and rugated. Cervix with normal contour, no lesions. Normal discharge.  + large amount of pooling.   Cervical exam:  Dilation: 4 Effacement (%): 70 Station: -2 Presentation: Vertex Exam by:: J.Mitcheal Sweetin,NP  Fetal Monitoring: Baseline: 145 bpm Variability: moderate Accelerations: 15x15 Decelerations: quick variables  Contractions: 2-3  No results found for this or any previous visit (from the past 24 hour(s)).   A: SIUP at 3668w6d  SROM  P: Report given to RN to contact MD on call for further instructions  Stephens Shreve, Harolyn RutherfordJennifer I, NP 10/22/2017 2:04 AM

## 2017-10-22 NOTE — Anesthesia Procedure Notes (Signed)
Epidural Patient location during procedure: OB Start time: 10/22/2017 5:58 AM End time: 10/22/2017 6:04 AM  Staffing Anesthesiologist: Cecile Hearingurk, Stephen Edward, MD Performed: anesthesiologist   Preanesthetic Checklist Completed: patient identified, pre-op evaluation, timeout performed, IV checked, risks and benefits discussed and monitors and equipment checked  Epidural Patient position: sitting Prep: DuraPrep Patient monitoring: blood pressure and continuous pulse ox Approach: midline Location: L3-L4 Injection technique: LOR air  Needle:  Needle type: Tuohy  Needle gauge: 17 G Needle length: 9 cm Needle insertion depth: 4 cm Catheter size: 19 Gauge Catheter at skin depth: 9 cm Test dose: negative and Other (1% Lidocaine)  Additional Notes Patient identified.  Risk benefits discussed including failed block, incomplete pain control, headache, nerve damage, paralysis, blood pressure changes, nausea, vomiting, reactions to medication both toxic or allergic, and postpartum back pain.  Patient expressed understanding and wished to proceed.  All questions were answered.  Sterile technique used throughout procedure and epidural site dressed with sterile barrier dressing. No paresthesia or other complications noted. The patient did not experience any signs of intravascular injection such as tinnitus or metallic taste in mouth nor signs of intrathecal spread such as rapid motor block. Please see nursing notes for vital signs. Reason for block:procedure for pain

## 2017-10-22 NOTE — H&P (Signed)
Taylor Rangel is a 32 y.o. G2P1001 at 2640w6d gestation presents for complaint of Contractions and Loss of fluid. Began leaking fluid around 2300 last night and regular and painful back pain that was progressively getting worse.  Then had a gush of fluid at MAU.  Denies vaginal bleeding and has noted FM.  Antepartum course: history of prior c-section , [facility] since 10 wks.  See complete pre-natal records  History OB History    Gravida  2   Para  1   Term  1   Preterm      AB      Living  1     SAB      TAB      Ectopic      Multiple      Live Births  1          Past Medical History:  Diagnosis Date  . Anal fissure    Past Surgical History:  Procedure Laterality Date  . CESAREAN SECTION N/A 01/17/2014   Procedure: Primary CESAREAN SECTION;  Surgeon: Robley FriesVaishali R Mody, MD;  Location: [facility];  Service: Obstetrics;  Laterality: N/A;  EDD: 02/02/14   Family History: family history is not on file. Social History:  reports that she has never smoked. She has never used smokeless tobacco. She reports that she does not drink alcohol or use drugs.  ROS: See above otherwise negative  Prenatal labs:  ABO, Rh: --/--/B POS (07/28 0250) Antibody: NEG (07/28 0250) Rubella: Immune (01/07 0000) RPR: Nonreactive (01/07 0000)  HBsAg: Negative (01/07 0000)  HIV:Non-reactive (01/07 0000)  GBS: Negative (07/08 0000)  1 hr Glucola: Normal Genetic screening: Normal Anatomy US: Normal  Physical Exam:   Dilation: 7.5 Effacement (%): 80 Station: -2 Exam by:: J.Follmer,RNC Blood pressure 102/63, pulse (!) 107, temperature 98.5 F (36.9 C), temperature source Oral, resp. rate 16, height 5' (1.524 m), weight 126 lb (57.2 kg), last menstrual period 11/26/2016, SpO2 99 %. A&O x 3 HEENT: Normal Lungs: CTAB CV: RRR Abdominal: Soft, Non-tender, Gravid and Estimated fetal weight: 6 lbs  Lower Extremities: Non-edematous, Non-tender  Pelvic  Exam: Deferred as exam just done by nurse  Labs:  CBC:  Lab Results  Component Value Date   WBC 10.2 10/22/2017   RBC 4.24 10/22/2017   HGB 13.1 10/22/2017   HCT 37.9 10/22/2017   MCV 89.4 10/22/2017   MCH 30.9 10/22/2017   MCHC 34.6 10/22/2017   RDW 13.8 10/22/2017   PLT 220 10/22/2017   CMP: No results found for: NA, K, CL, CO2, GLUCOSE, BUN, CREATININE, CALCIUM, PROT, AST, ALT, ALBUMIN, ALKPHOS, BILITOT, GFRNONAA, GFRAA, ANIONGAP Urine: Lab Results  Component Value Date   COLORURINE YELLOW 01/22/2016   APPEARANCEUR CLEAR 01/22/2016   LABSPEC 1.006 01/22/2016   PHURINE 6.0 01/22/2016   GLUCOSEU NEGATIVE 01/22/2016   HGBUR NEGATIVE 01/22/2016   BILIRUBINUR NEGATIVE 01/22/2016   KETONESUR NEGATIVE 01/22/2016   PROTEINUR NEGATIVE 01/22/2016   NITRITE NEGATIVE 01/22/2016   LEUKOCYTESUR TRACE (A) 01/22/2016     Prenatal Transfer Tool  Maternal Diabetes: No Genetic Screening: Normal Maternal Ultrasounds/Referrals: Normal Fetal Ultrasounds or other Referrals:  None Maternal Substance Abuse:  No Significant Maternal Medications:  None Significant Maternal Lab Results: Lab values include: Group B Strep negative  Fht: 150s, normal variability, +accels, occasional variable decels and early decels, one late decel about 1-2 min with hypotensive episode, resolved and back to baseline Toco: 1-2 min  Assessment/Plan:  32 y.o. [id number]  at [redacted]w[redacted]d gestation with h/o ltcs (for breech) desires tolac  1. SROM, active labor - admit for delivery and tolac; I have reveiwed risks with patient including risk of uterine rupture which can cause maternal hemorrhage and fetal death though uterine rupture occurs <1%.  Patient has signed consent.  Plan vbac. 2. Fetal status reassuring overall, will follow closely; efw at 36 wga 32% (5'8") 3. gbs neg 4. Rh pos   Vick Frees 10/22/2017, 9:36 AM

## 2017-10-22 NOTE — MAU Note (Signed)
c/o ctx since 11pm tonight about every 5 min. reports pinkish vaginal discharge and good fetal movement. 2cm at last cervical check in office.

## 2017-10-22 NOTE — Anesthesia Pain Management Evaluation Note (Signed)
  [facility] Pain Management Visit Note  Patient: Taylor MoanSneha Desmith, 32 y.o., female  "Hello I am a member of the anesthesia team at [facility]. We have an anesthesia team available at all times to provide care throughout the hospital, including epidural management and anesthesia for C-section. I don't know your plan for the delivery whether it a natural birth, water birth, IV sedation, nitrous supplementation, doula or epidural, but we want to meet your pain goals."   1.Was your pain managed to your expectations on prior hospitalizations?   Yes   2.What is your expectation for pain management during this hospitalization?     Epidural  3.How can we help you reach that goal? Maintain epidural until delivery of infant.  Record the patient's initial score and the patient's pain goal.   Pain: 1  Pain Goal: 1 [facility] wants you to be able to say your pain was always managed very well.  Hawkin Charo 10/22/2017

## 2017-10-22 NOTE — Lactation Note (Signed)
This note was copied from a baby's chart. Lactation Consultation Note  Patient Name: Taylor Malachy MoanSneha Dearman RUEAV'WToday's Date: 10/22/2017 Reason for consult: Initial assessment;Early term 37-38.6wks;Difficult latch  Baby is 3 hours old  L/D called LC due to a difficult latch and challenging tissue - flat nipples.  LC saw mom , baby and dad in Room 166, mom noted to very tired and dad falling asleep  Sitting on the couch.  Baby wide awake, hungry and rooting, intermittently sucking on her upper lip.  LC offered to assist with latch and mom receptive. LC 1st assessed breast tissue and noted  Semi compressible areolas, small nipples, semi erect. LC reviewed hand expressing, tiny drop noted.  Mom so tired she didn't repeat demo.  LC attempted to latch in the laid back position and baby unable to sustain latch for only a few sucks.  LC sized mom for #16 NS and #20 NS after pre-pumping with hand pump and the #20 NS accommodated the  Areola more. Switched mom to side lying right side/ applied the NS and baby latched shallow at 1st and then  A deeper latch for 23 mins, swallows noted, increased with breast compressions.  Baby was able to pull the nipple up into the Nipple shield and per mom was comfortable entire feeding.  Also with the #24 flange for pre-pumping.  Baby released on her own from the breast.  Due to mom being so sleepy and dad sound asleep on the couch/ LC called for the LD RN caring for the baby.  LC reviewed set up of the hand pump for pre-pumping and application of the NS with the latch.    Mom mentioned her 1st baby -  Difficult latch and had to use a NS 1st week, and then mom pumped for total of 4 months, never felt she  Had a big milk supply. The most she ever pumped off at one time was 90 ml.  Per mom will have a DEBP at home/ [facility]   Mother informed of post-discharge support and given phone number to the lactation department, including services for phone call assistance;  out-patient appointments; and breastfeeding support group. List of other breastfeeding resources in the community given in the handout. Encouraged mother to call for problems or concerns related to breastfeeding.      Maternal Data Has patient been taught Hand Expression?: Yes(small drop noted ) Does the patient have breastfeeding experience prior to this delivery?: Yes  Feeding Feeding Type: Breast Fed Length of feed: 23 min(swallows noted )  LATCH Score Latch: Repeated attempts needed to sustain latch, nipple held in mouth throughout feeding, stimulation needed to elicit sucking reflex.  Audible Swallowing: A few with stimulation  Type of Nipple: Everted at rest and after stimulation(compressible areola / and nipple more erect with pre-pumping )  Comfort (Breast/Nipple): Soft / non-tender  Hold (Positioning): Assistance needed to correctly position infant at breast and maintain latch.  LATCH Score: 7  Interventions Interventions: Breast feeding basics reviewed;Assisted with latch;Skin to skin;Breast massage;Pre-pump if needed;Breast compression;Reverse pressure;Adjust position;Support pillows;Position options;Hand pump  Lactation Tools Discussed/Used Tools: Shells;Pump;Nipple Shields(will need shells betwen feeding discussed with mom ) ) Nipple shield size: 16;20;Other (comment)(tried to latch 1st , no luck, #20 NS best fit ) Shell Type: Inverted Breast pump type: Manual WIC Program: No Pump Review: Setup, frequency, and cleaning Initiated by:: MAI  Date initiated:: 10/22/17   Consult Status Consult Status: Follow-up Date: 10/23/17 Follow-up type: In-patient    Taylor Rangel  10/22/2017, 5:38 PM

## 2017-10-22 NOTE — Anesthesia Preprocedure Evaluation (Signed)
Anesthesia Evaluation  Patient identified by MRN, date of birth, ID band Patient awake    Reviewed: Allergy & Precautions, NPO status , Patient's Chart, lab work & pertinent test results  Airway Mallampati: II  TM Distance: >3 FB Neck ROM: Full    Dental  (+) Teeth Intact, Dental Advisory Given   Pulmonary neg pulmonary ROS,    Pulmonary exam normal breath sounds clear to auscultation       Cardiovascular negative cardio ROS Normal cardiovascular exam Rhythm:Regular Rate:Normal     Neuro/Psych negative neurological ROS     GI/Hepatic negative GI ROS, Neg liver ROS,   Endo/Other  negative endocrine ROS  Renal/GU negative Renal ROS     Musculoskeletal negative musculoskeletal ROS (+)   Abdominal   Peds  Hematology negative hematology ROS (+) Plt 220k   Anesthesia Other Findings Day of surgery medications reviewed with the patient.  Reproductive/Obstetrics (+) Pregnancy S/p C-section x1                              Anesthesia Physical Anesthesia Plan  ASA: II  Anesthesia Plan: Epidural   Post-op Pain Management:    Induction:   PONV Risk Score and Plan: 2 and Treatment may vary due to age or medical condition  Airway Management Planned: Natural Airway  Additional Equipment:   Intra-op Plan:   Post-operative Plan:   Informed Consent: I have reviewed the patients History and Physical, chart, labs and discussed the procedure including the risks, benefits and alternatives for the proposed anesthesia with the patient or authorized representative who has indicated his/her understanding and acceptance.   Dental advisory given  Plan Discussed with:   Anesthesia Plan Comments: (Patient identified. Risks/Benefits/Options discussed with patient including but not limited to bleeding, infection, nerve damage, paralysis, failed block, incomplete pain control, headache, blood pressure  changes, nausea, vomiting, reactions to medication both or allergic, itching and postpartum back pain. Confirmed with bedside nurse the patient's most recent platelet count. Confirmed with patient that they are not currently taking any anticoagulation, have any bleeding history or any family history of bleeding disorders. Patient expressed understanding and wished to proceed. All questions were answered. )        Anesthesia Quick Evaluation

## 2017-10-23 LAB — CBC
HEMATOCRIT: 29.6 % — AB (ref 36.0–46.0)
Hemoglobin: 9.8 g/dL — ABNORMAL LOW (ref 12.0–15.0)
MCH: 30.4 pg (ref 26.0–34.0)
MCHC: 33.1 g/dL (ref 30.0–36.0)
MCV: 91.9 fL (ref 78.0–100.0)
Platelets: 191 10*3/uL (ref 150–400)
RBC: 3.22 MIL/uL — ABNORMAL LOW (ref 3.87–5.11)
RDW: 13.9 % (ref 11.5–15.5)
WBC: 13.2 10*3/uL — AB (ref 4.0–10.5)

## 2017-10-23 NOTE — Lactation Note (Signed)
This note was copied from a baby's chart. Lactation Consultation Note  Patient Name: Taylor Rangel ZOXWR'UToday's Date: 10/23/2017   Baby 32 hours old.  RN requested assistance w/ feedings. Mother pumping upon entering room. Had mother sit more upright so colostrum goes into bottle. Reviewed how to finger syringe feed and spoon fed baby drops. Baby cueing.  Assisted mother with latching baby without nipple shield.  Baby latched with intermittent sucking. Demonstrated how to align baby and place pillows under baby for support. Mother states baby sucks better with nipple shield and applied #16 NS (her preference). After a few minutes baby fell asleep. Suggest mother continue pumping for 10 more minutes. Mother requested LC change infant's diaper prior to leaving room.      Maternal Data    Feeding    LATCH Score                   Interventions    Lactation Tools Discussed/Used     Consult Status      Hardie PulleyBerkelhammer, Taylor Rangel 10/23/2017, 11:10 PM

## 2017-10-23 NOTE — Lactation Note (Signed)
This note was copied from a baby's chart. Lactation Consultation Note  Patient Name: Taylor Rangel VWUJW'JToday's Date: 10/23/2017 Reason for consult: Follow-up assessment;Difficult latch;Early term 8637-38.6wks  P2 mother whose infant is now 7322 hours old.  Mother has been having some difficulty with latch but states it is getting better.  She is using a NS.  Mother has small breasts with semi compressible breast tissue and nipples are very short shafted bilaterally.  Reviewed feeding 8-12 times/24 hours or more if baby shows cues.  Encouraged STS, breast massage and hand expression before and after feedings.  Since baby is having some difficulty and it is now close to 24 hours I suggested initiating the DEBP and mother is receptive.  Reviewed pump parts, assembly and disassembly, and cleaning of pump parts.  Discussed different options for supplementing when mother gets EBM.  Mother will decide which tool to use when she obtains more than just drops.  She knows how to finger feed EBM drops to baby.    Mother will call for latch assistance as needed.  Baby is sleeping in her arms at the present time and not showing feeding cues.  Mother is alone in the room but her mother will be arriving shortly.  Father of baby was here earlier today.      Maternal Data Formula Feeding for Exclusion: No Has patient been taught Hand Expression?: Yes Does the patient have breastfeeding experience prior to this delivery?: Yes  Feeding    LATCH Score                   Interventions    Lactation Tools Discussed/Used WIC Program: No Pump Review: Setup, frequency, and cleaning;Milk Storage Initiated by:: Taylor Rangel Date initiated:: 10/23/17   Consult Status Consult Status: Follow-up Date: 10/24/17 Follow-up type: In-patient    Taylor Rangel 10/23/2017, 1:07 PM

## 2017-10-23 NOTE — Anesthesia Postprocedure Evaluation (Signed)
Anesthesia Post Note  Patient: Taylor Rangel  Procedure(s) Performed: AN AD HOC LABOR EPIDURAL     Patient location during evaluation: Mother Baby Anesthesia Type: Epidural Level of consciousness: awake and alert and oriented Pain management: satisfactory to patient Vital Signs Assessment: post-procedure vital signs reviewed and stable Respiratory status: respiratory function stable Cardiovascular status: stable Postop Assessment: no headache, no backache, epidural receding, patient able to bend at knees, no signs of nausea or vomiting and adequate PO intake Anesthetic complications: no    Last Vitals:  Vitals:   10/23/17 0335 10/23/17 0700  BP: (!) 86/52 (!) 79/51  Pulse: 79 73  Resp: 16 16  Temp: 36.9 C 36.7 C  SpO2:      Last Pain:  Vitals:   10/23/17 0700  TempSrc: Oral  PainSc:    Pain Goal: Patients Stated Pain Goal: 0 (10/22/17 0136)               Karleen DolphinFUSSELL,Taleisha Kaczynski

## 2017-10-23 NOTE — Progress Notes (Signed)
Post Partum Day 1. VBAC 7/28, 2.36 PM. Cervical tear. GIRL.  Subjective: no complaints, up ad lib, voiding and tolerating PO. Very sleepy  Objective: Blood pressure (!) 79/51, pulse 73, temperature 98 F (36.7 C), temperature source Oral, resp. rate 16, height 5' (1.524 m), weight 126 lb (57.2 kg), last menstrual period 11/26/2016, SpO2 99 %.  Physical Exam:  General: alert and cooperative  CV RRR, Lungs CTA bilat Lochia: appropriate Uterine Fundus: soft Incision: healing well, no significant drainage DVT Evaluation: No evidence of DVT seen on physical exam.  CBC Latest Ref Rng & Units 10/23/2017 10/22/2017 10/22/2017  WBC 4.0 - 10.5 K/uL 13.2(H) 16.1(H) 10.2  Hemoglobin 12.0 - 15.0 g/dL 1.6(X9.8(L) 11.1(L) 13.1  Hematocrit 36.0 - 46.0 % 29.6(L) 32.8(L) 37.9  Platelets 150 - 400 K/uL 191 178 220    Assessment/Plan: Plan for discharge tomorrow, Breastfeeding and Lactation consult Routine PP care   LOS: 1 day   Robley FriesVaishali R Ezmae Speers 10/23/2017, 11:49 AM

## 2017-10-24 ENCOUNTER — Encounter (HOSPITAL_COMMUNITY): Payer: Self-pay | Admitting: Lactation Services

## 2017-10-24 DIAGNOSIS — D62 Acute posthemorrhagic anemia: Secondary | ICD-10-CM | POA: Diagnosis not present

## 2017-10-24 MED ORDER — MAGNESIUM OXIDE 400 (241.3 MG) MG PO TABS
400.0000 mg | ORAL_TABLET | Freq: Every day | ORAL | Status: DC
  Administered 2017-10-24: 400 mg via ORAL
  Filled 2017-10-24 (×2): qty 1

## 2017-10-24 MED ORDER — MAGNESIUM OXIDE 400 (241.3 MG) MG PO TABS
400.0000 mg | ORAL_TABLET | Freq: Every day | ORAL | 0 refills | Status: AC
Start: 2017-10-24 — End: ?

## 2017-10-24 MED ORDER — POLYSACCHARIDE IRON COMPLEX 150 MG PO CAPS: 150.0000 mg | ORAL_CAPSULE | Freq: Every day | ORAL | 0 refills | Status: AC

## 2017-10-24 MED ORDER — IBUPROFEN 800 MG PO TABS: 800.0000 mg | ORAL_TABLET | Freq: Four times a day (QID) | ORAL | 0 refills | Status: AC

## 2017-10-24 MED ORDER — POLYSACCHARIDE IRON COMPLEX 150 MG PO CAPS
150.0000 mg | ORAL_CAPSULE | Freq: Every day | ORAL | Status: DC
  Administered 2017-10-24: 150 mg via ORAL
  Filled 2017-10-24: qty 1

## 2017-10-24 NOTE — Lactation Note (Signed)
This note was copied from a baby's chart. Lactation Consultation Note: [facility] with latching infant . Assist mother to chair and pillows placed for good support. Mother taught to latch infant on using cross cradle hold.  Infant sustained latch for 15 mins on the left breast.  Mother taught to flange infants lips for wide gape.  Observed strong suckling and audible swallows.  Mother reports slight discomfort . No observed pinching on the nipple. Lots of teaching on proper latch with good depth.   Infant sustained latch on alternate breast for 15 mins. Using football hold.  Mother was given comfort gels. Observed slight pink tissue on the rt nipple.  Assist mother with hand expression and infant was spoon fed 1 ml by staff nurse. Mother fit with a #20 nipple shield . Infant latched on and pushed off. Mother taught to properly apply the nipple shield and advised in flanging infants lips for wide gape.    Reviewed Mother and Baby book on " How do I know my baby is getting enough.'" Discussed importance of cue base feeding and that cluster feeding is normal .  Mother advised to continue to do frequent skin to skin and feed infant at least 8-12 times in 24 hours.  Mother has a Theatre stage managerHygia electric pump. Advised to post pump until milk comes to volume.  Suggested that mother hand express and spoon or cup feed after each feeding.   Mother [facility] aware [facility] and community support. Discussed BFSG'S , outpatient dept and 24/7 phone line for breastfeeding questions or concerns.      Patient Name: Taylor Malachy MoanSneha Deshpande ZOXWR'UToday's Date: 10/24/2017 Reason for consult: Follow-up assessment   Maternal Data    Feeding Feeding Type: Breast Fed Length of feed: 15 min  LATCH Score Latch: Grasps breast easily, tongue down, lips flanged, rhythmical sucking.  Audible Swallowing: Spontaneous and intermittent  Type of Nipple: Everted at rest and after  stimulation  Comfort (Breast/Nipple): Filling, red/small blisters or bruises, mild/mod discomfort  Hold (Positioning): Assistance needed to correctly position infant at breast and maintain latch.  LATCH Score: 8  Interventions Interventions: Assisted with latch;Skin to skin;Support pillows;Comfort gels;Breast massage;Expressed milk  Lactation Tools Discussed/Used Tools: (to bare breast) Nipple shield size: 16 Breast pump type: Double-Electric Breast Pump   Consult Status Consult Status: Follow-up Date: 10/24/17 Follow-up type: In-patient    Stevan BornKendrick, Jasher [facility] 10/24/2017, 10:44 AM

## 2017-10-24 NOTE — Progress Notes (Signed)
PPD 2 SVD  S:  Reports feeling still a lot of cramps and pain in bottom             Questioned about packing - states nurse took sponge out 2 days ago             Tolerating po/ No nausea or vomiting             Bleeding is light             Pain controlled with motrin             Up ad lib / ambulatory / voiding QS  Newborn Breast   O:   VS: BP (!) 79/45 (BP Location: Right Arm)   Pulse 65   Temp 97.8 F (36.6 C) (Oral)   Resp 16   Ht 5' (1.524 m)   Wt 57.2 kg (126 lb)   LMP 11/26/2016   SpO2 100%   BMI 24.61 kg/m   LABS:             Recent Labs    10/22/17 2047 10/23/17 0612  WBC 16.1* 13.2*  HGB 11.1* 9.8*  PLT 178 191               Blood type: --/--/B POS (07/28 0250)  Rubella: Immune (01/07 0000)                              Physical Exam:             Alert and oriented X3  Abdomen: soft, non-tender, non-distended              Fundus: firm, non-tender, Ueven  Perineum: ice pack in place - moderate edema  Lochia: light  Extremities: no edema, no calf pain or tenderness  Per delivery note - vaginal packing placed - no order or note for removal.  RTC-Dr Almquist states order to nurse for removal in BS by nurse before transfer to PP   A: PPD # 2 with 1st degree repair / cervical LAC repaired             ABL anemia - breastfeeding  Doing well - stable status  P: Routine post partum orders  DC home  Taylor Rangel CNM, MSN, [facility] 10/24/2017, 10:36 AM

## 2017-10-24 NOTE — Lactation Note (Signed)
This note was copied from a baby's chart. Lactation Consultation Note: Mother and infant both sleeping in her bed. Advised mother to place infant in crib if she plans to take a nap.  Mother reports that infant fed with the #16 NS. She denies seeing any colostrum in the shield. Mother hand expresses easily.  Mothers Lt nipple has a positional strip. Mother reports discomfort with breastfeeding.  Discussed importance of proper latch and good depth. Discussed checking the nipple shield for milk transfer.  Advised mother of cue base feeding and feeding infant at least 8-12 times in 24 hours. Discussed cluster feeding.  Mother aware that this behavior is normal .  Mother advised to page [facility] when infant wakes and shows signs of feeding cues.  Reviewed treatment and prevention of engorgement.   Patient Name: Taylor Rangel Reason for consult: Follow-up assessment   Maternal Data    Feeding Feeding Type: Breast Fed Length of feed: 10 min  LATCH Score Latch: Grasps breast easily, tongue down, lips flanged, rhythmical sucking.  Audible Swallowing: A few with stimulation  Type of Nipple: Everted at rest and after stimulation  Comfort (Breast/Nipple): Filling, red/small blisters or bruises, mild/mod discomfort(left nipple sore, bleeding)  Hold (Positioning): No assistance needed to correctly position infant at breast.  LATCH Score: 8  Interventions    Lactation Tools Discussed/Used Tools: Nipple Shields Nipple shield size: 16 Breast pump type: Double-Electric Breast Pump   Consult Status Consult Status: Follow-up(mother to page for latch assistance when infant cues) Date: 10/24/17 Follow-up type: In-patient    Taylor Rangel, Taylor Rangel Rangel, 9:14 AM

## 2017-10-24 NOTE — Progress Notes (Signed)
Mother consents to [facility] [employer] interview today.

## 2017-10-30 NOTE — Discharge Summary (Signed)
DISCHARGE SUMMARY  Patient ID: Taylor Rangel MRN: [id number] DOB/AGE: 30/04/1985 30 y.o.  Admit date: 10/22/2017 Admission Diagnoses: labor onset   Discharge date: 10/24/2017  Discharge Diagnoses: PPD 2 s/p SVD with cervical laceration repair / ABL anemia  Prenatal history: G2P1001   EDC : 10/30/2017, Alternate EDD Entry  [facility]  Prenatal course uncomplicated   Prenatal Labs: ABO, Rh: --/--/B POS (07/28 0250)  Antibody: NEG (07/28 0250) Rubella: Immune (01/07 0000)  RPR: Non Reactive (07/28 0250)  HBsAg: Negative (01/07 0000)  HIV: Non-reactive (01/07 0000)  GBS: Negative (07/08 0000)   Medical / Surgical History : Past medical history:  Past Medical History:  Diagnosis Date  . Anal fissure     Past surgical history:  Past Surgical History:  Procedure Laterality Date  . CESAREAN SECTION N/A 01/17/2014   Procedure: Primary CESAREAN SECTION;  Surgeon: Robley FriesVaishali R Mody, MD;  Location: [facility];  Service: Obstetrics;  Laterality: N/A;  EDD: 02/02/14    Family History:  Family History  Problem Relation Age of Onset  . Colon cancer Neg Hx     Social History:  reports that she has never smoked. She has never used smokeless tobacco. She reports that she does not drink alcohol or use drugs.  Allergies: Patient has no known allergies.   Current Medications at time of admission:  Prior to Admission medications   Medication Sig Start Date End Date Taking? Authorizing Provider  Prenatal Vit-Fe Fumarate-FA (PRENATAL MULTIVITAMIN) TABS tablet Take 1 tablet by mouth daily at 12 noon.   Yes [provider]   Intrapartum Course:  Admit for onset labor with labor progression to 10 dilation with normal labor curve Pain management: epidural Complicated by: 1st degree and cervical LAC / ABL  Interventions required: repair of lacerations / iron supplements  Procedures: SVD - female newborn  APGAR (1 MIN): 8   APGAR (5 MINS): 9      Discharge Instructions: Discharged Condition: stable Activity: pelvic rest and postoperativerestrictions x 2  Diet: routine Medications:  Allergies as of 10/24/2017   No Known Allergies     Medication List    TAKE these medications   ibuprofen 800 MG tablet Commonly known as:  ADVIL,MOTRIN Take 1 tablet (800 mg total) by mouth every 6 (six) hours.   iron polysaccharides 150 MG capsule Commonly known as:  NIFEREX Take 1 capsule (150 mg total) by mouth daily.   magnesium oxide 400 (241.3 Mg) MG tablet Commonly known as:  MAG-OX Take 1 tablet (400 mg total) by mouth daily.   prenatal multivitamin Tabs tablet Take 1 tablet by mouth daily at 12 noon.      Postpartum Instructions: [facility] discharge booklet - instructions reviewed Discharge to: Home Follow up :  [facility] in 6 weeks for routine postpartum visit        Signed: Marlinda Mikeanya Phillips [place] CNM, MSN, [facility] 10/30/2017, 10:39 AM

## 2019-09-28 IMAGING — US US OB < 14 WEEKS - US OB TV
1 series · 14 of 28 positions shown · non-contrast
Comparison: None.

CLINICAL DATA: Vaginal bleeding

EXAM:
OBSTETRIC <14 WK US AND TRANSVAGINAL OB US
TECHNIQUE: Both transabdominal and transvaginal ultrasound examinations were
performed for complete evaluation of the gestation as well as the
maternal uterus, adnexal regions, and pelvic cul-de-sac.
Transvaginal technique was performed to assess early pregnancy.

[Series 1: us ob < 14 weeks - us ob tv · 0.14mm/px · 14 of 30 slices shown]
[im 2/30]
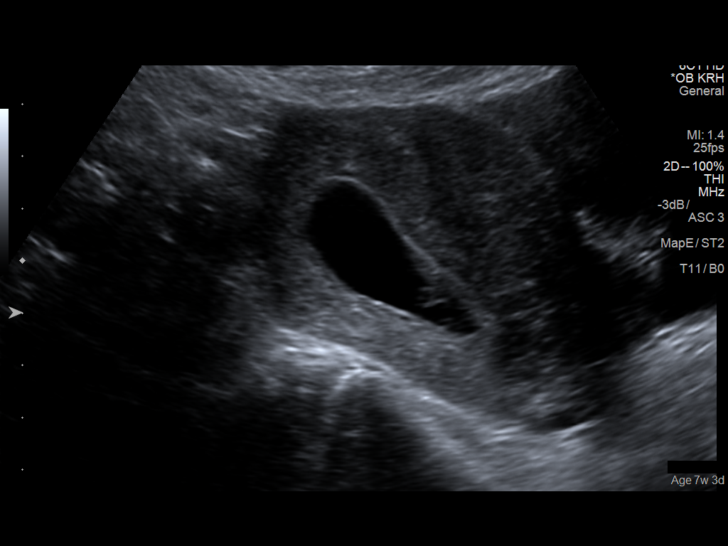
[im 4/30]
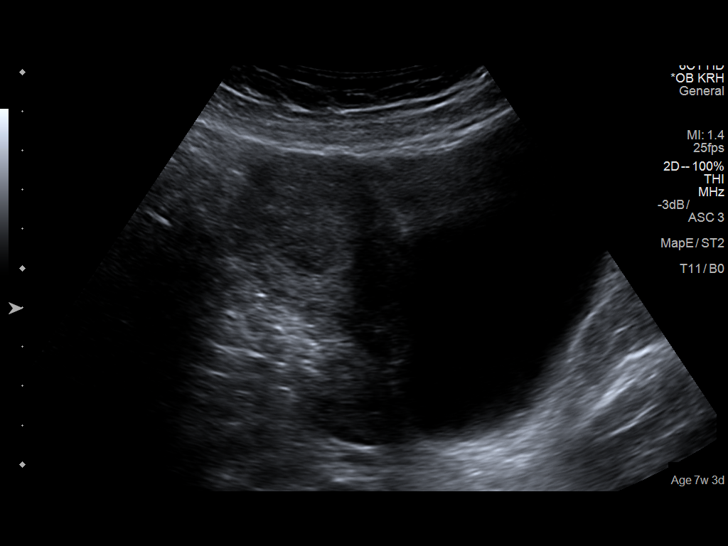
[im 6/30]
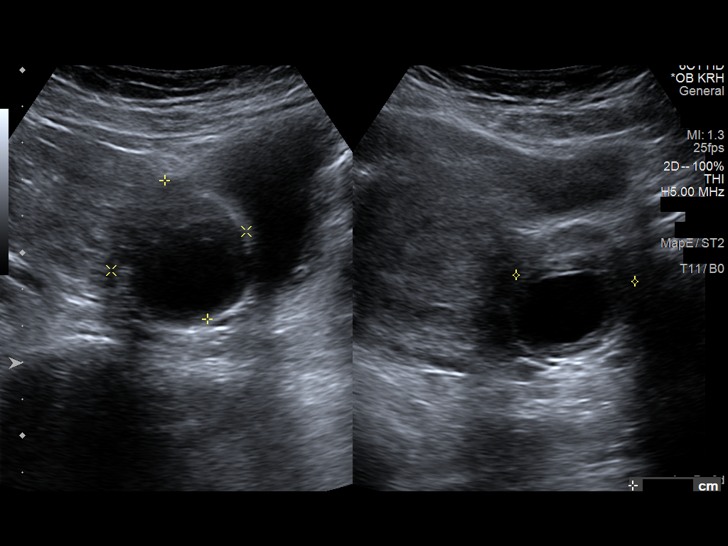
[im 8/30]
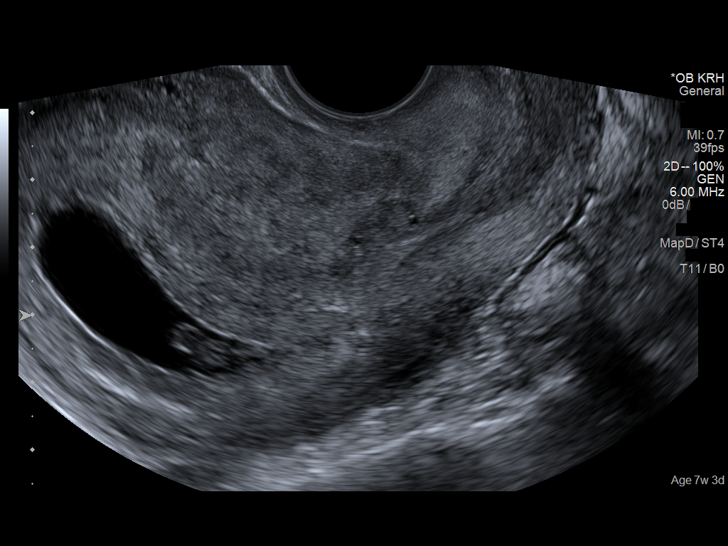
[im 10/30]
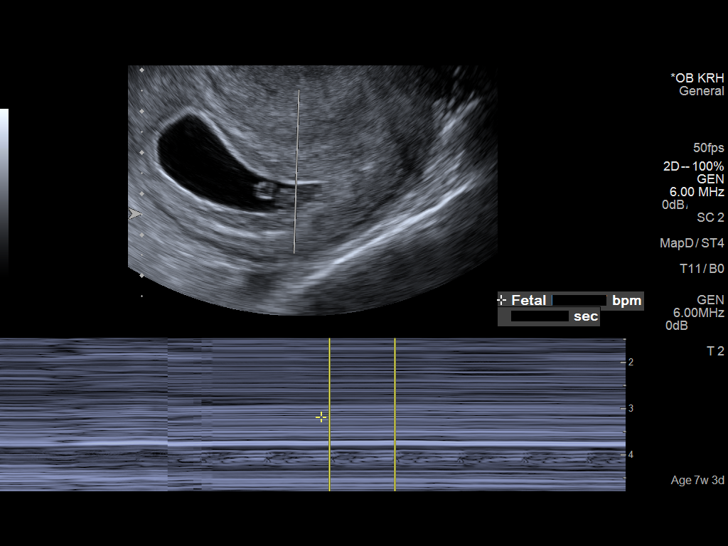
[im 12/30]
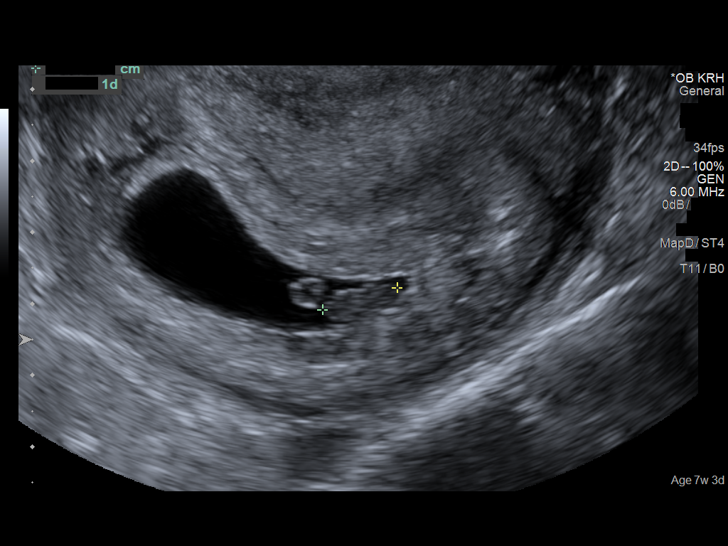
[im 14/30]
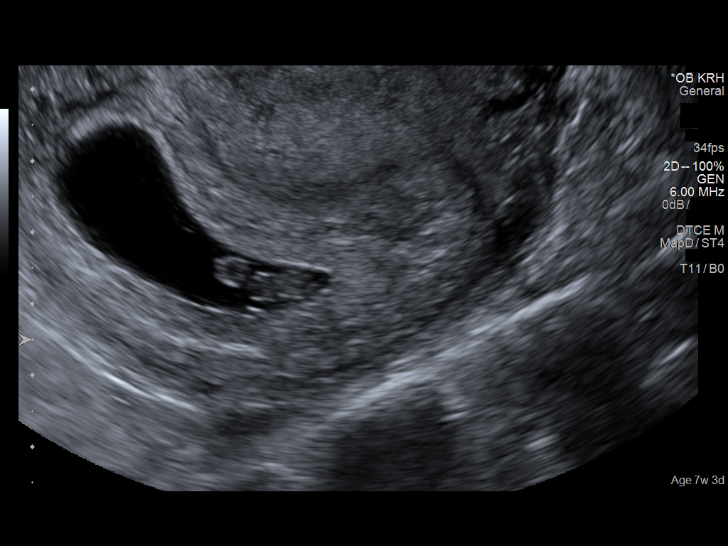
[im 17/30]
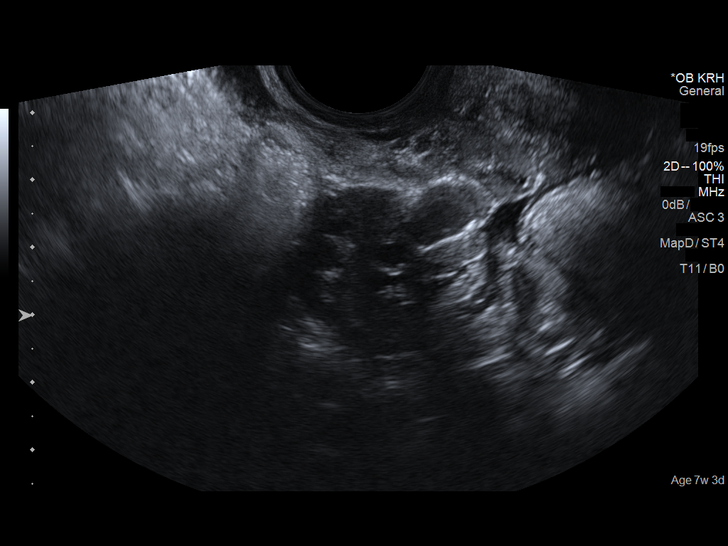
[im 19/30]
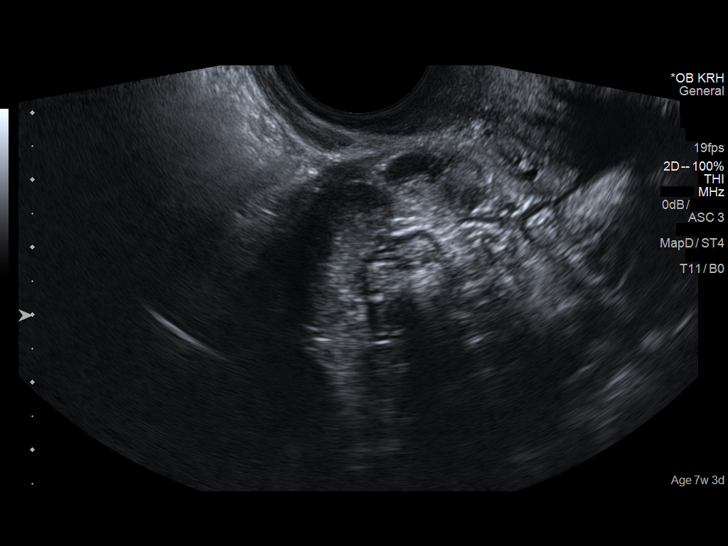
[im 21/30]
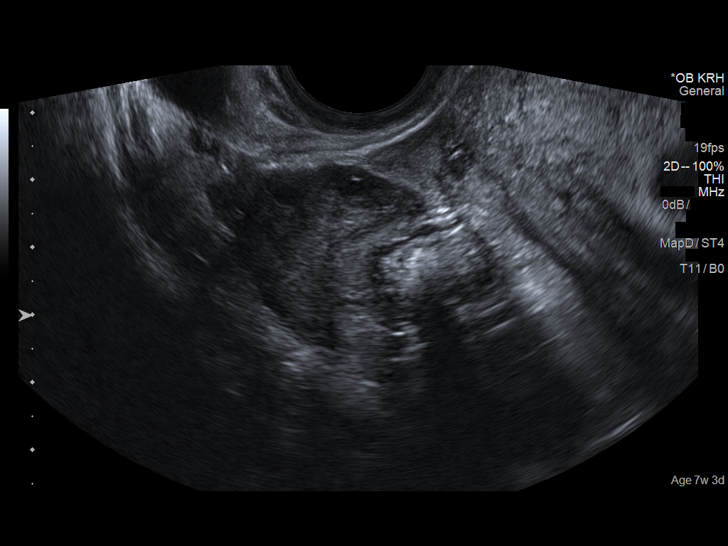
[im 23/30]
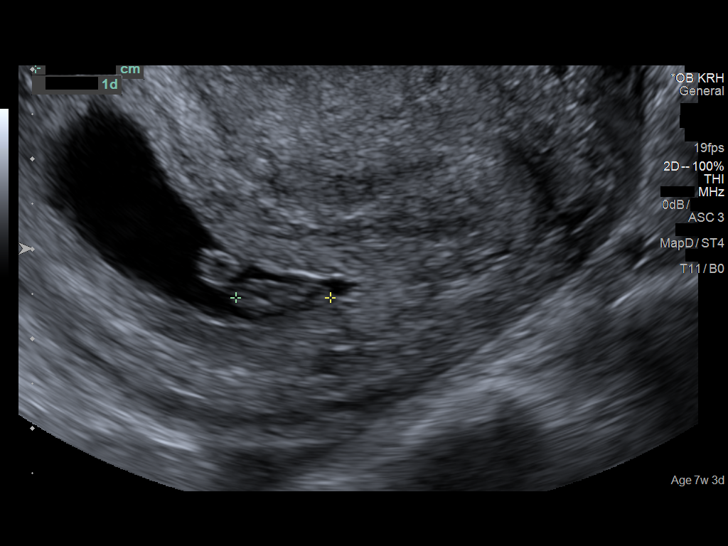
[im 25/30]
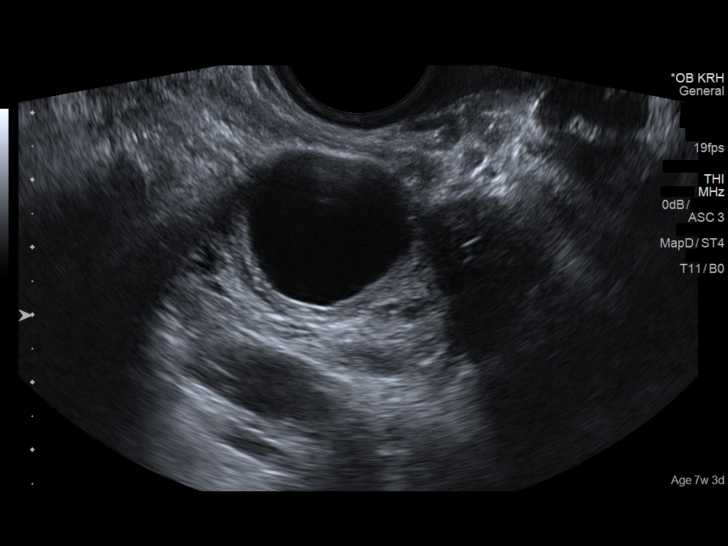
[im 27/30]
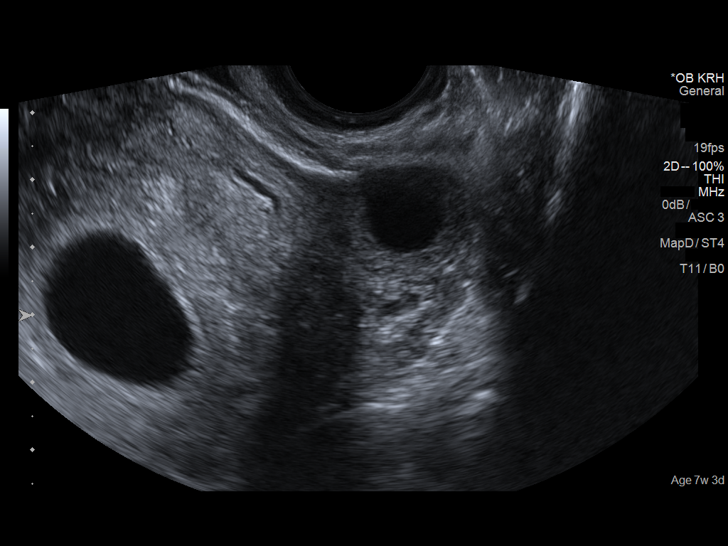
[im 30/30]
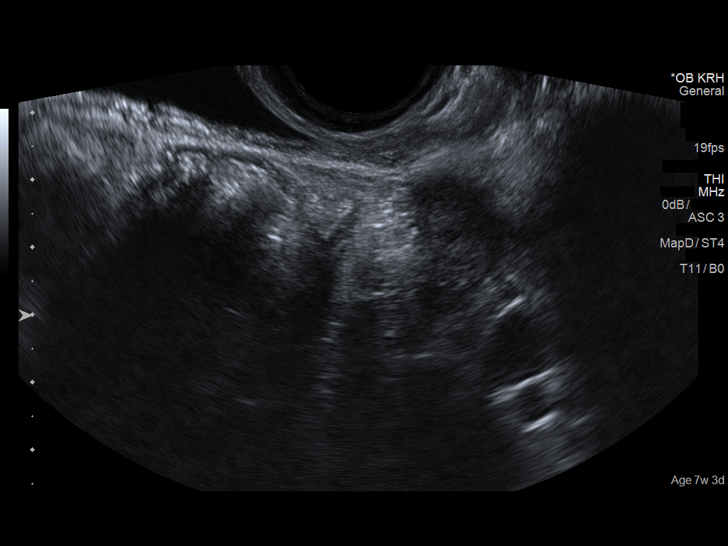

[14 of 28 positions shown; findings below may reference images not displayed]

FINDINGS: Intrauterine gestational sac: Visible

Yolk sac:  Visible

Embryo:  Visible

Cardiac Activity: Visible

Heart Rate: 157  bpm

CRL:  10.6  mm   7 w   1 d                  US EDC: 11/01/2017

Subchorionic hemorrhage:  None visualized.

Maternal uterus/adnexae: Ovaries are within normal limits. Left
ovary measures 3.5 x 2.4 x 2.7 cm. Luteal cyst in the left ovary.
The right ovary measures 2.9 x 1.7 x 2.4 cm. No significant free
fluid.
IMPRESSION: Single viable intrauterine pregnancy as above. No specific
abnormalities are visualized.
